# Patient Record
Sex: Female | Born: 1990 | Race: White | Hispanic: No | Marital: Single | Smoking: Current every day smoker
Health system: Southern US, Community
[De-identification: ages and names within clinical notes are randomized; demographics above are authoritative.]

## PROBLEM LIST (undated history)

## (undated) DIAGNOSIS — F329 Major depressive disorder, single episode, unspecified: Secondary | ICD-10-CM

## (undated) DIAGNOSIS — F419 Anxiety disorder, unspecified: Secondary | ICD-10-CM

## (undated) DIAGNOSIS — J45909 Unspecified asthma, uncomplicated: Secondary | ICD-10-CM

## (undated) DIAGNOSIS — K8020 Calculus of gallbladder without cholecystitis without obstruction: Secondary | ICD-10-CM

## (undated) DIAGNOSIS — F32A Depression, unspecified: Secondary | ICD-10-CM

## (undated) DIAGNOSIS — K219 Gastro-esophageal reflux disease without esophagitis: Secondary | ICD-10-CM

## (undated) HISTORY — DX: Unspecified asthma, uncomplicated: J45.909

## (undated) HISTORY — DX: Anxiety disorder, unspecified: F41.9

## (undated) HISTORY — DX: Gastro-esophageal reflux disease without esophagitis: K21.9

## (undated) HISTORY — DX: Depression, unspecified: F32.A

## (undated) HISTORY — DX: Major depressive disorder, single episode, unspecified: F32.9

---
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
## 2009-07-26 ENCOUNTER — Emergency Department (HOSPITAL_COMMUNITY): Admission: EM | Admit: 2009-07-26 | Discharge: 2009-07-26 | Payer: Self-pay | Admitting: Emergency Medicine

## 2011-12-06 ENCOUNTER — Emergency Department: Payer: Self-pay | Admitting: Emergency Medicine

## 2011-12-06 LAB — URINALYSIS, COMPLETE
Bacteria: NONE SEEN
Bilirubin,UR: NEGATIVE
Blood: NEGATIVE
Glucose,UR: NEGATIVE mg/dL (ref 0–75)
Ketone: NEGATIVE
Leukocyte Esterase: NEGATIVE
Nitrite: NEGATIVE
Ph: 5 (ref 4.5–8.0)
Protein: 30
RBC,UR: 2 /HPF (ref 0–5)
Specific Gravity: 1.029 (ref 1.003–1.030)
Squamous Epithelial: 8
WBC UR: 2 /HPF (ref 0–5)

## 2011-12-06 LAB — COMPREHENSIVE METABOLIC PANEL
Albumin: 3.8 g/dL (ref 3.4–5.0)
Alkaline Phosphatase: 68 U/L (ref 50–136)
Anion Gap: 10 (ref 7–16)
BUN: 5 mg/dL — ABNORMAL LOW (ref 7–18)
Bilirubin,Total: 0.4 mg/dL (ref 0.2–1.0)
Calcium, Total: 9.5 mg/dL (ref 8.5–10.1)
Chloride: 101 mmol/L (ref 98–107)
Co2: 25 mmol/L (ref 21–32)
Creatinine: 0.69 mg/dL (ref 0.60–1.30)
EGFR (African American): >60
EGFR (Non-African Amer.): >60
Glucose: 109 mg/dL — ABNORMAL HIGH (ref 65–99)
Osmolality: 270 (ref 275–301)
Potassium: 4.1 mmol/L (ref 3.5–5.1)
SGOT(AST): 27 U/L (ref 15–37)
SGPT (ALT): 25 U/L
Sodium: 136 mmol/L (ref 136–145)
Total Protein: 8.2 g/dL (ref 6.4–8.2)

## 2011-12-06 LAB — CBC
HCT: 44.2 % (ref 35.0–47.0)
HGB: 14.5 g/dL (ref 12.0–16.0)
MCH: 28.7 pg (ref 26.0–34.0)
MCHC: 32.9 g/dL (ref 32.0–36.0)
MCV: 87 fL (ref 80–100)
Platelet: 231 10*3/uL (ref 150–440)
RBC: 5.06 10*6/uL (ref 3.80–5.20)
RDW: 14.3 % (ref 11.5–14.5)
WBC: 17.8 10*3/uL — ABNORMAL HIGH (ref 3.6–11.0)

## 2011-12-06 LAB — PREGNANCY, URINE: Pregnancy Test, Urine: NEGATIVE m[IU]/mL

## 2011-12-06 LAB — LIPASE, BLOOD: Lipase: 117 U/L (ref 73–393)

## 2012-06-14 HISTORY — PX: HAND SURGERY: SHX662

## 2013-01-10 ENCOUNTER — Emergency Department: Payer: Self-pay | Admitting: Emergency Medicine

## 2013-07-10 ENCOUNTER — Emergency Department: Payer: Self-pay

## 2013-07-10 LAB — CBC
HCT: 43 % (ref 35.0–47.0)
HGB: 14.1 g/dL (ref 12.0–16.0)
MCH: 27.7 pg (ref 26.0–34.0)
MCHC: 32.9 g/dL (ref 32.0–36.0)
MCV: 84 fL (ref 80–100)
Platelet: 225 10*3/uL (ref 150–440)
RBC: 5.1 10*6/uL (ref 3.80–5.20)
RDW: 14.7 % — ABNORMAL HIGH (ref 11.5–14.5)
WBC: 8.2 10*3/uL (ref 3.6–11.0)

## 2013-07-10 LAB — COMPREHENSIVE METABOLIC PANEL
Albumin: 3.4 g/dL (ref 3.4–5.0)
Alkaline Phosphatase: 55 U/L
Anion Gap: 6 — ABNORMAL LOW (ref 7–16)
BILIRUBIN TOTAL: 0.2 mg/dL (ref 0.2–1.0)
BUN: 15 mg/dL (ref 7–18)
Calcium, Total: 9.1 mg/dL (ref 8.5–10.1)
Chloride: 107 mmol/L (ref 98–107)
Co2: 26 mmol/L (ref 21–32)
Creatinine: 0.7 mg/dL (ref 0.60–1.30)
EGFR (African American): >60
Glucose: 98 mg/dL (ref 65–99)
Osmolality: 278 (ref 275–301)
POTASSIUM: 3.7 mmol/L (ref 3.5–5.1)
SGOT(AST): 20 U/L (ref 15–37)
SGPT (ALT): 22 U/L (ref 12–78)
Sodium: 139 mmol/L (ref 136–145)
TOTAL PROTEIN: 7.1 g/dL (ref 6.4–8.2)

## 2013-07-10 LAB — URINALYSIS, COMPLETE
Bacteria: NONE SEEN
Bilirubin,UR: NEGATIVE
Blood: NEGATIVE
Glucose,UR: NEGATIVE mg/dL (ref 0–75)
Ketone: NEGATIVE
Leukocyte Esterase: NEGATIVE
Nitrite: NEGATIVE
Ph: 7 (ref 4.5–8.0)
Protein: NEGATIVE
RBC,UR: 2 /HPF (ref 0–5)
Specific Gravity: 1.02 (ref 1.003–1.030)
Squamous Epithelial: 8
WBC UR: 1 /HPF (ref 0–5)

## 2013-07-22 ENCOUNTER — Emergency Department: Payer: Self-pay | Admitting: Emergency Medicine

## 2013-07-24 ENCOUNTER — Ambulatory Visit: Payer: Self-pay | Admitting: Orthopedic Surgery

## 2013-07-26 ENCOUNTER — Ambulatory Visit: Payer: Self-pay | Admitting: Orthopedic Surgery

## 2014-02-16 ENCOUNTER — Emergency Department: Payer: Self-pay | Admitting: Emergency Medicine

## 2014-03-24 IMAGING — CT CT HAND*L* W/O CM
4 series · 16 of 33 positions shown, 19 images · non-contrast
Comparison: None.

CLINICAL DATA: Tripped and fell going up stairs and landed Left
hand. F/U abn hand x-ray showing Comminuted fracture proximal fifth
metacarpal. Dr. Bogdan Cantemir is doing this for surgical planning. no hx
surg.

EXAM:
CT OF THE LEFT HAND WITHOUT CONTRAST
TECHNIQUE: Multidetector CT imaging was performed according to the standard
protocol. Multiplanar CT image reconstructions were also generated.

[Series 603: axial bone · axial · 0.32mm/px · z∈[-90,+17]mm · 5 of 81 slices shown, 7 images]
[im 14/81  soft-tissue]
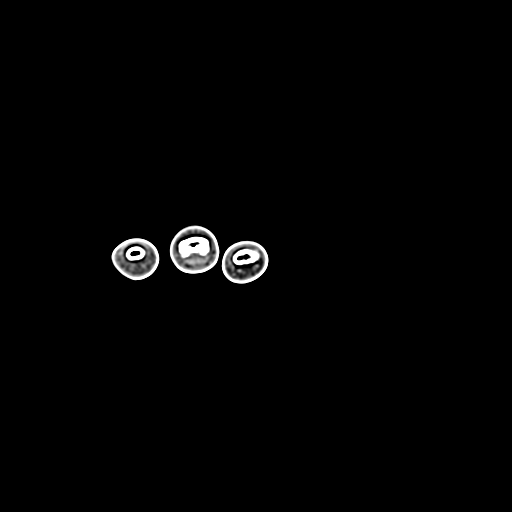
[im 14/81  bone]
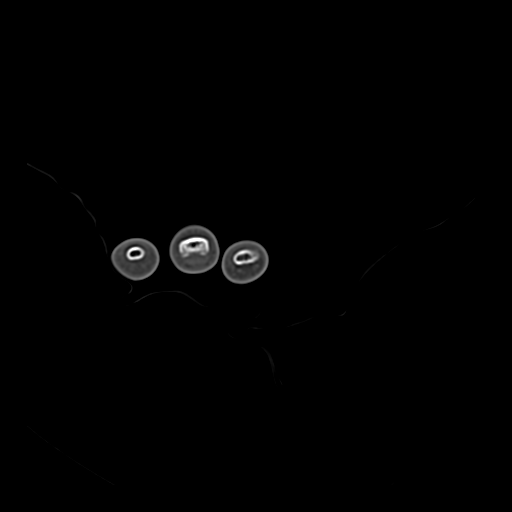
[im 27/81  bone]
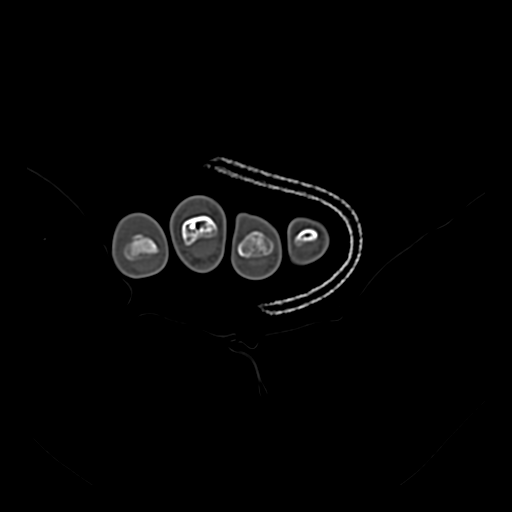
[im 41/81  bone]
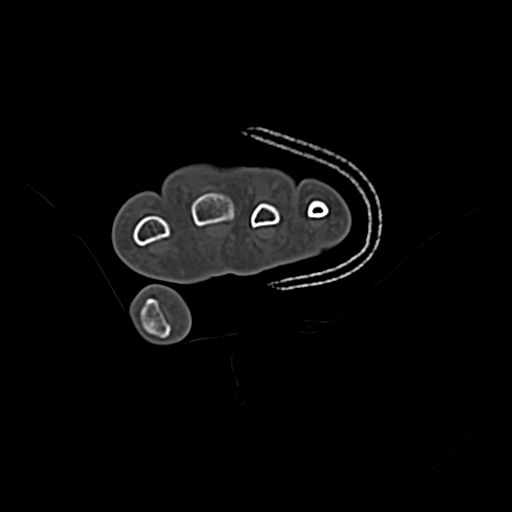
[im 54/81  bone]
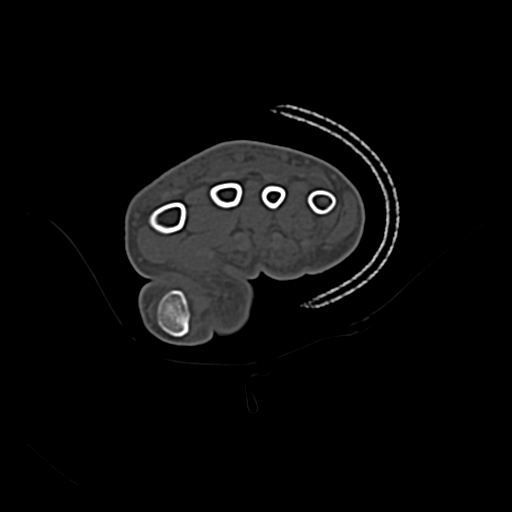
[im 67/81  soft-tissue]
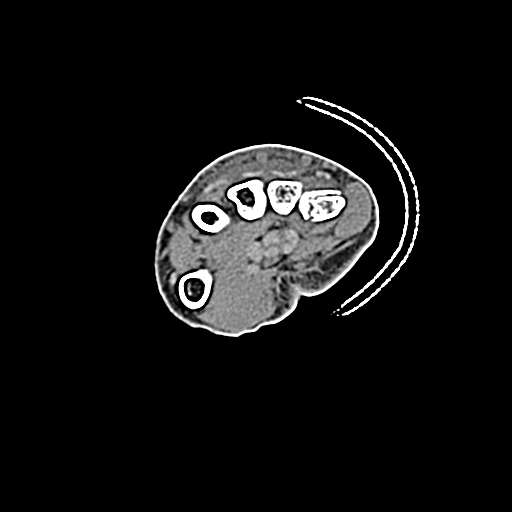
[im 67/81  bone]
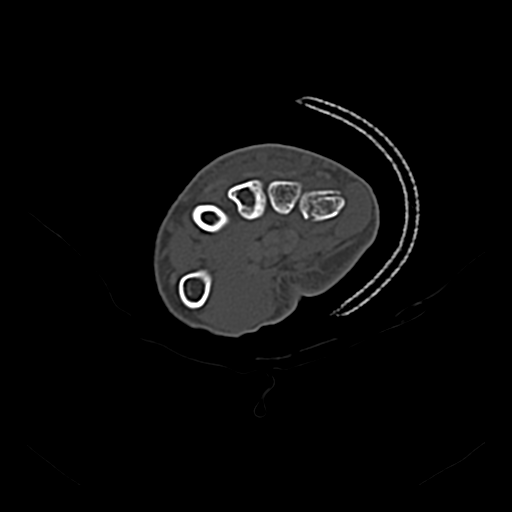

[Series 610: axial soft tissue · axial · 0.32mm/px · z∈[-86,-35]mm · 3 of 80 slices shown]
[im 14/80  soft-tissue]
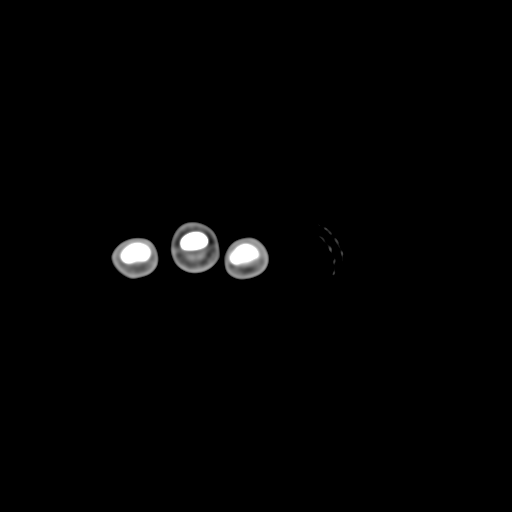
[im 27/80  soft-tissue]
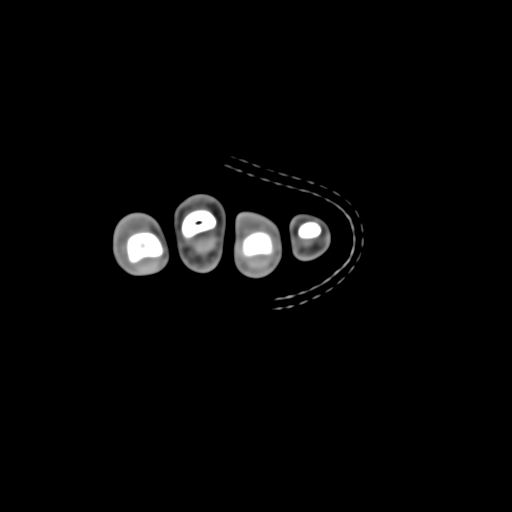
[im 40/80  soft-tissue]
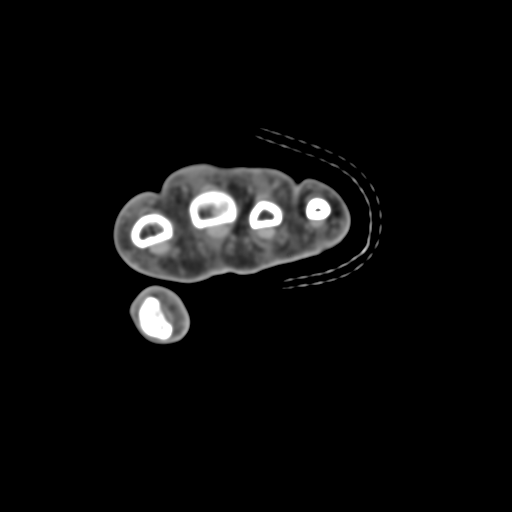

[Series 612: sag soft tissue · sagittal · 0.32mm/px · 5 of 47 slices shown, 6 images]
[im 16/47  bone]
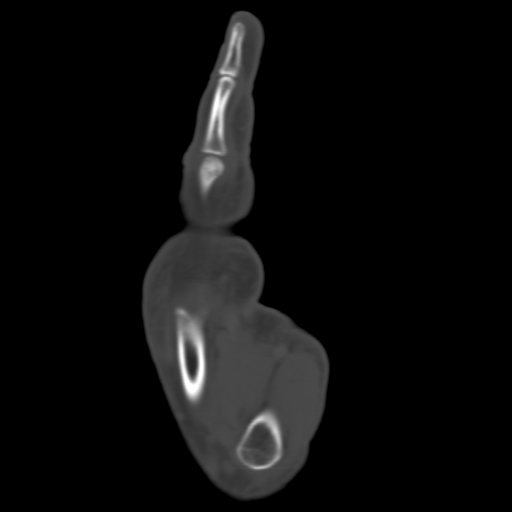
[im 20/47  bone]
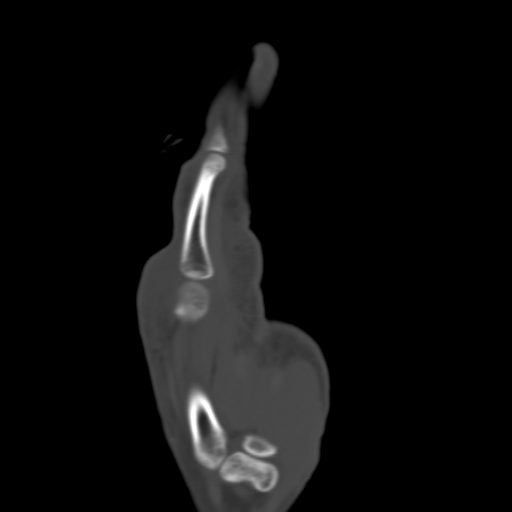
[im 24/47  soft-tissue]
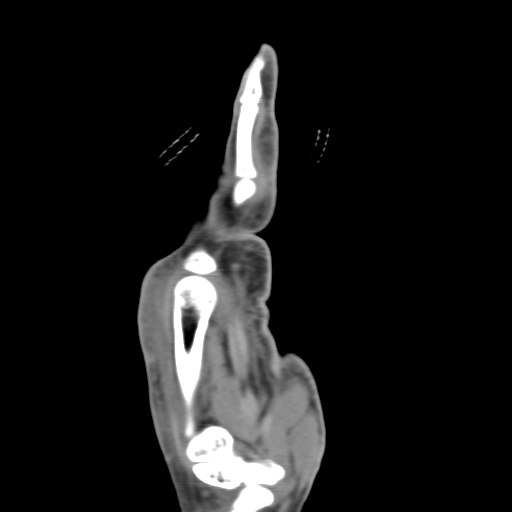
[im 24/47  bone]
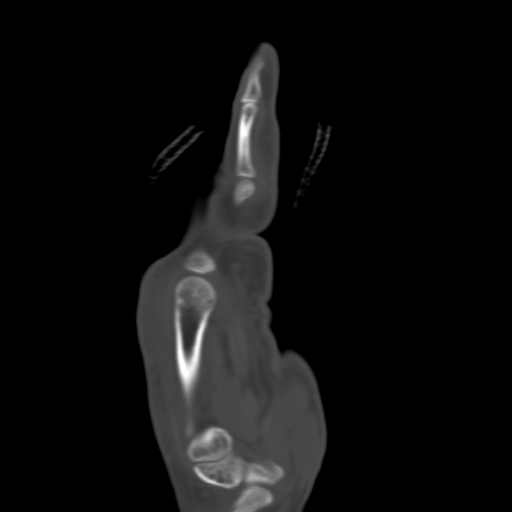
[im 27/47  bone]
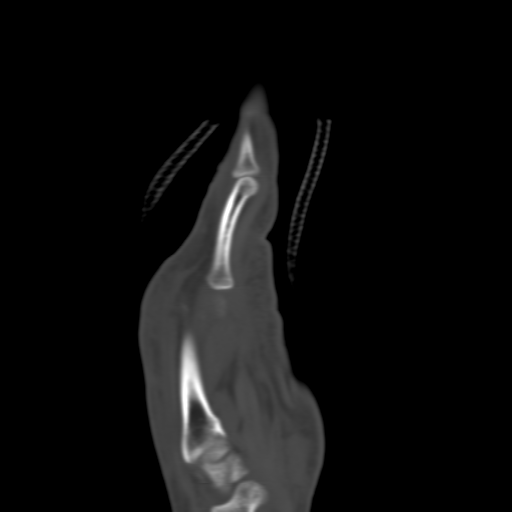
[im 31/47  bone]
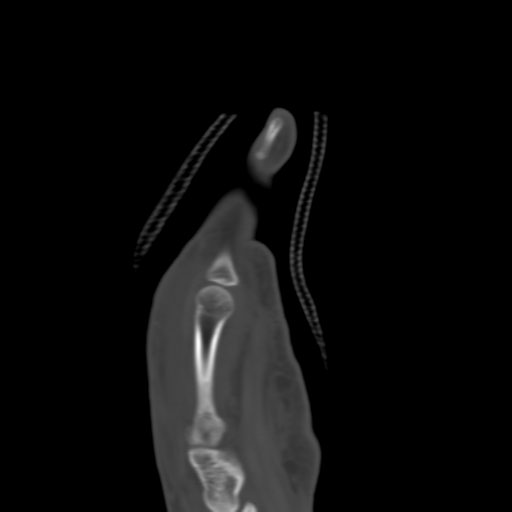

[Series 615: cor soft tissue · coronal · 0.32mm/px · 3 of 30 slices shown]
[im 6/30  bone]
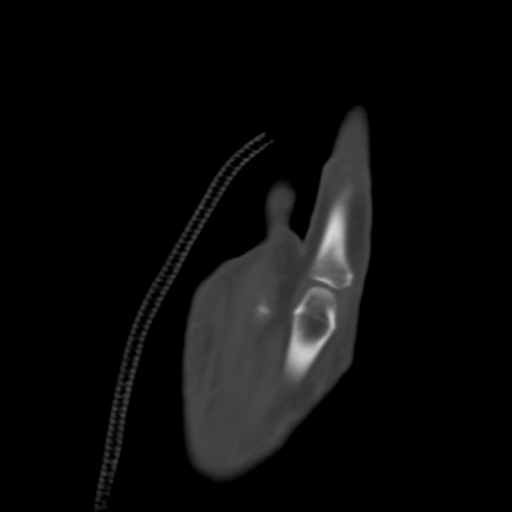
[im 12/30  bone]
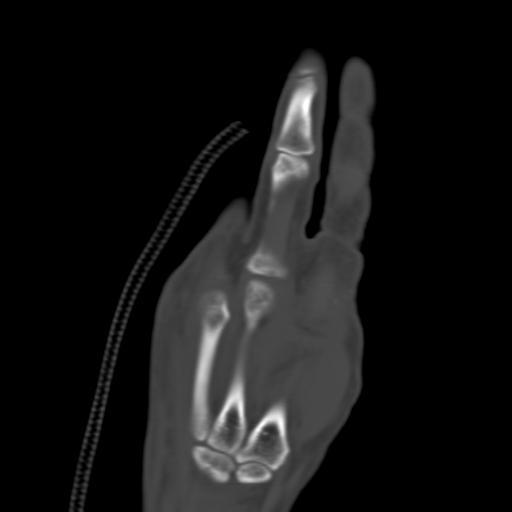
[im 18/30  bone]
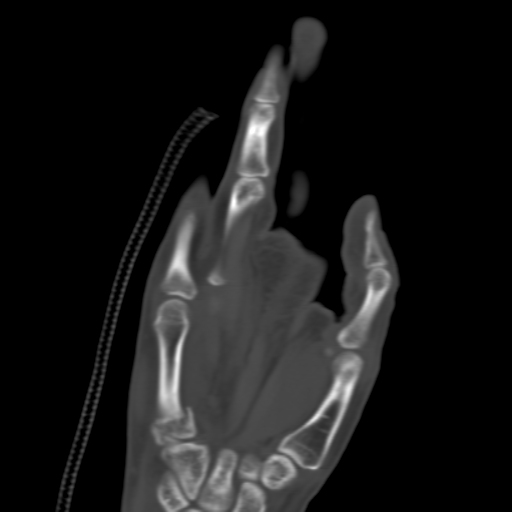

[16 of 33 positions shown; findings below may reference images not displayed]

FINDINGS: There is a mildly comminuted fracture at the base of the fifth
metacarpal with no significant angulation. The fracture cleft
extends to the articular surface of the fifth CMC joint. There is
2.5 mm of displacement of the lateral fracture fragment. The
intermetacarpal articulations are maintained.

There is no other fracture or dislocation. The soft tissues are
normal. There is no fluid collection or hematoma. There is mild soft
tissue swelling along the dorsal aspect of the hand. The flexor and
extensor tendons are grossly intact.
IMPRESSION: Mildly comminuted fracture of the base of the fifth metacarpal with
No significant angulation. The fracture cleft extends to the
articular surface of the fifth CMC joint.

## 2014-03-26 IMAGING — CR DG HAND 2V*L*
1 series · 2 of 2 positions shown · non-contrast
Comparison: 07/22/2013

CLINICAL DATA: Status post fixation of fifth metacarpal fracture

EXAM:
LEFT HAND - 2 VIEW

[Series 1: pa · 0.17mm/px · 2 of 2 slices shown]
[im 1/2]
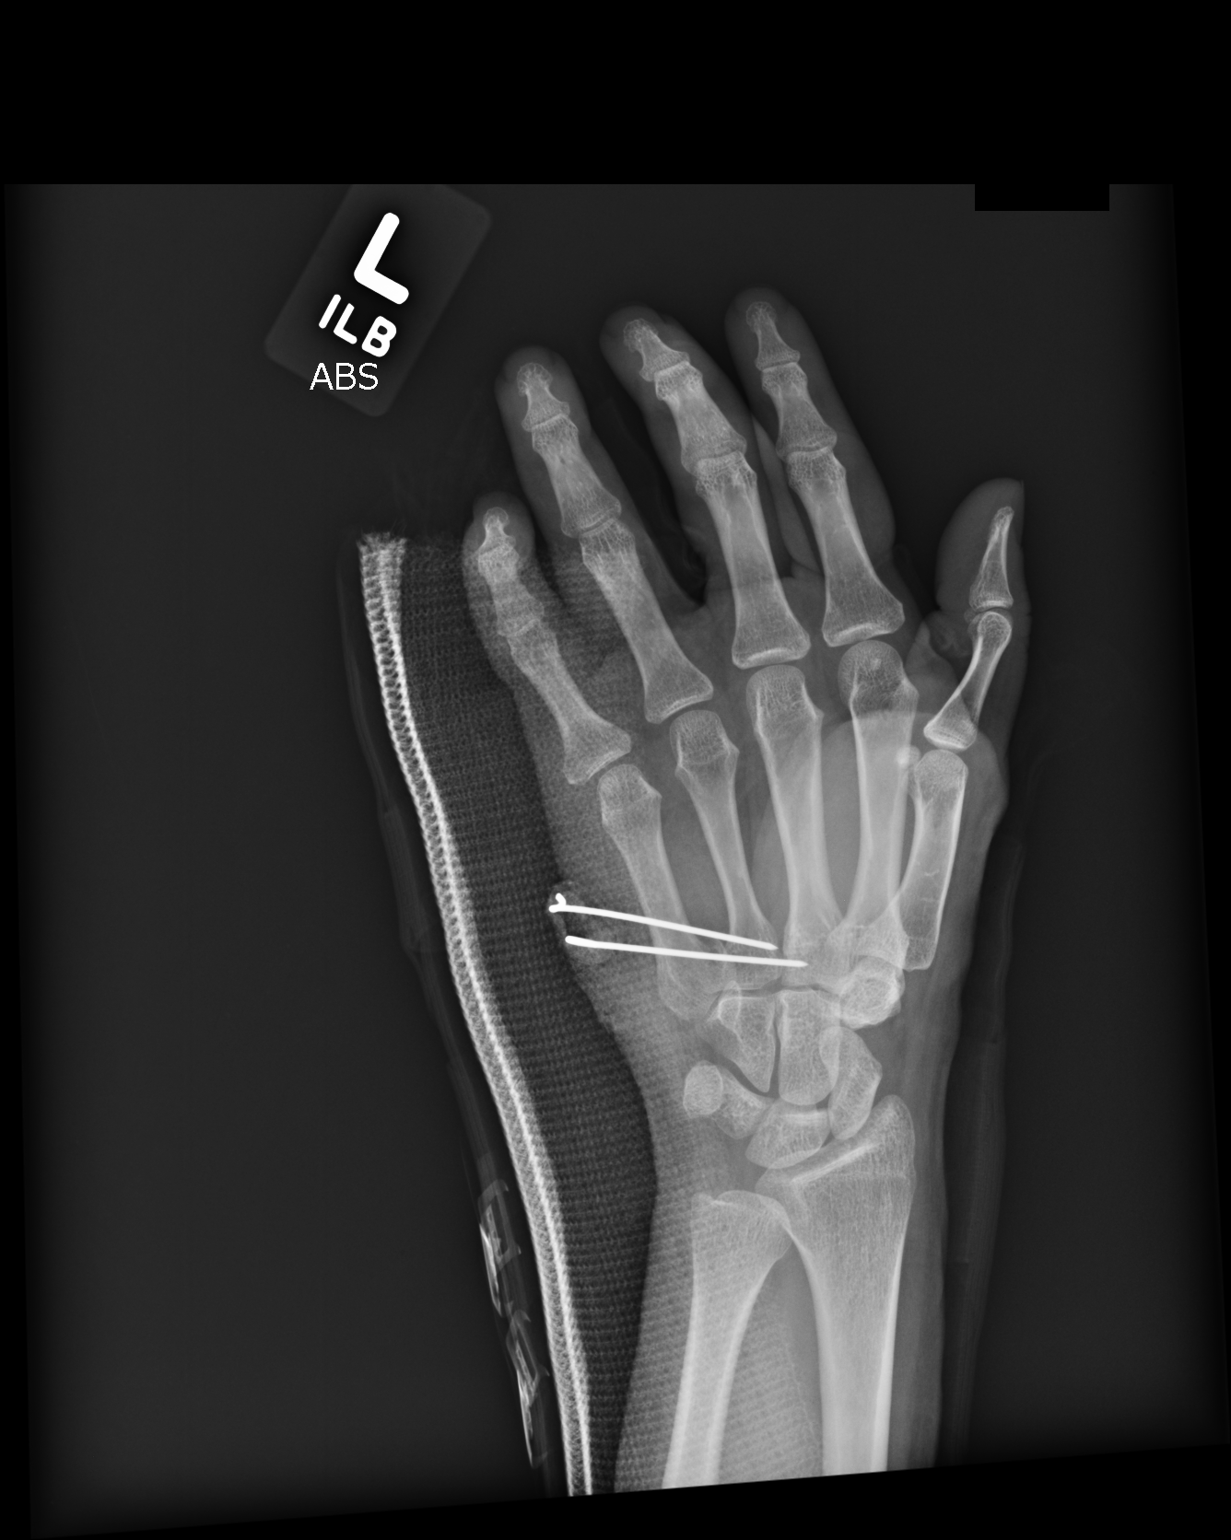
[im 2/2]
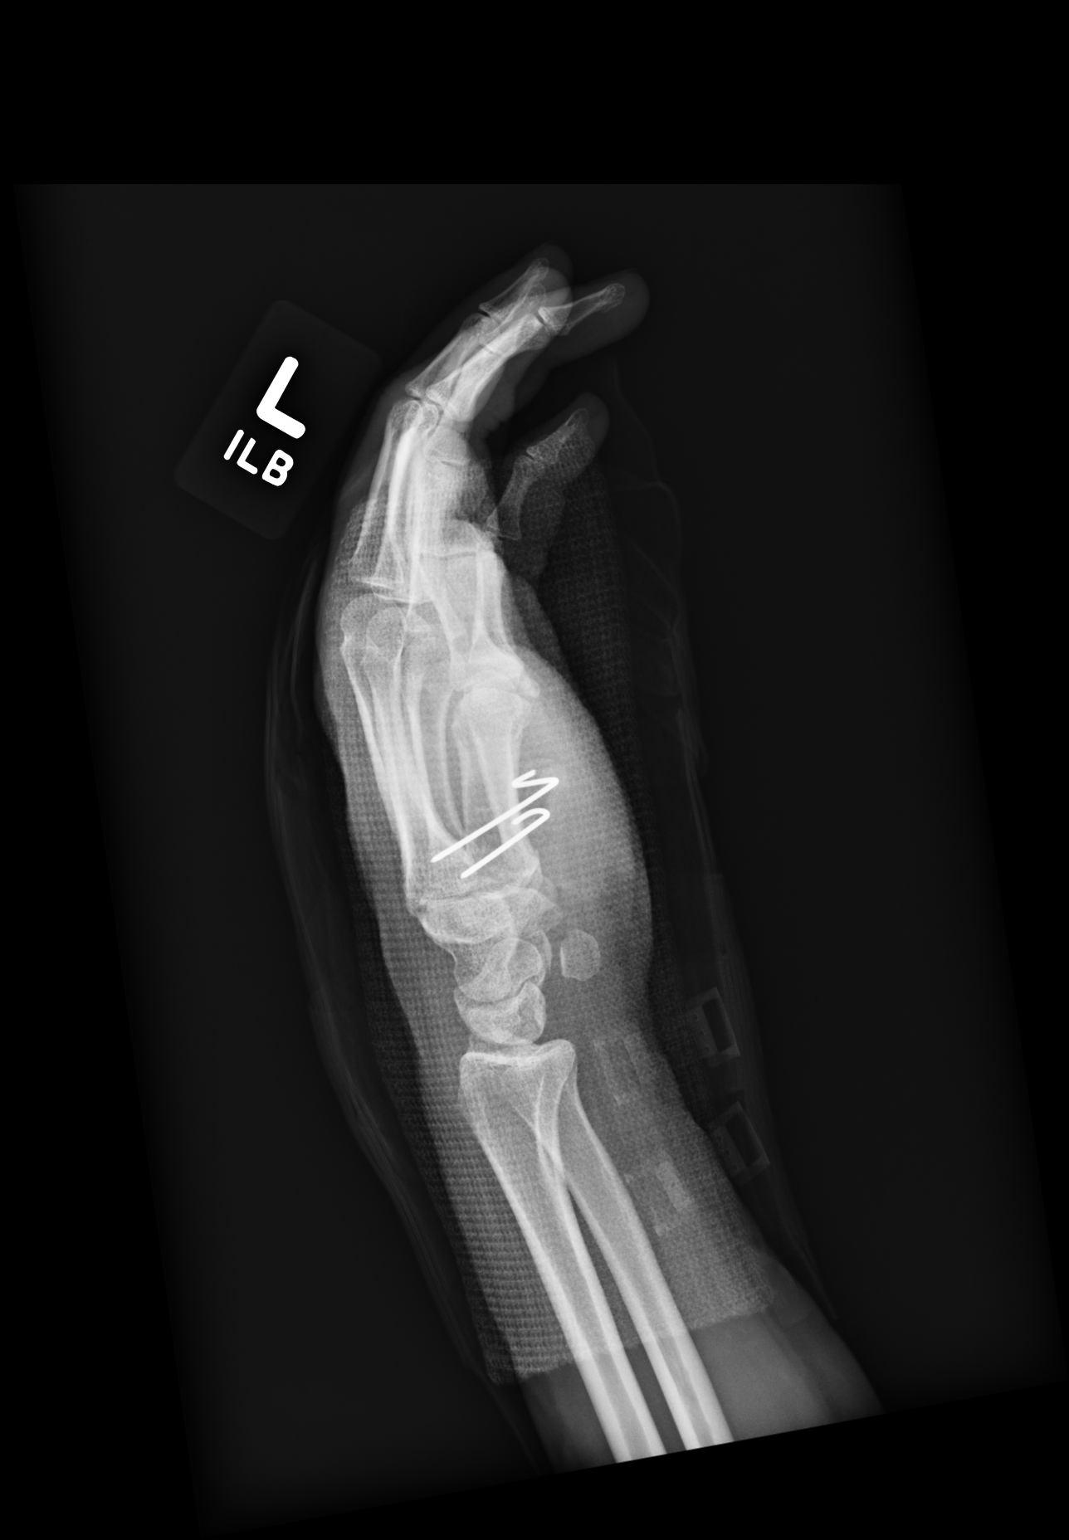

[2 of 2 positions shown; findings below may reference images not displayed]

FINDINGS: Two fixation wires are now noted traversing the base of the fifth
metacarpal. The fracture fragments are in near anatomic alignment.
No new focal abnormality is noted.

## 2014-10-05 NOTE — Op Note (Signed)
PATIENT NAME:  [name], [name] MR#:  [id number] DATE OF BIRTH:  11/30/1990  DATE OF PROCEDURE:  07/26/2013  PREOPERATIVE DIAGNOSIS: Base of left fifth metacarpal fracture.   POSTOPERATIVE DIAGNOSIS: Base of left fifth metacarpal fracture.   PROCEDURE: Closed reduction and pinning, left fifth metacarpal fracture.   ANESTHESIA: General.   SURGEON: Leitha SchullerMichael J. Perle Brickhouse, M.D.   DESCRIPTION OF PROCEDURE: The patient was brought to the operating room and after adequate anesthesia was obtained, the left arm was prepped and draped in the usual sterile fashion. After patient identification and timeout procedures were completed, the fracture was reduced under the mini C-arm and held in place. With the little finger held in place, 2 parallel K-wires were inserted through the metacarpal shaft into the fourth metacarpal as well as into the carpus. This gave rigid fixation and appropriate position, with good stability and anatomic alignment of the fragments. The pins bent over and cut short. The base was covered with Xeroform, followed by 4 x 4'[name], Webril and an ulnar gutter splint. This was covered with an Ace wrap. The patient was then sent to the recovery room in stable condition.   ESTIMATED BLOOD LOSS: Minimal.   COMPLICATIONS: None.   SPECIMEN: None.   IMPLANTS: A 0.45 K-wire x 2.   ____________________________ Leitha SchullerMichael J. Navy Belay, MD mjm:gb D: 07/26/2013 20:08:12 ET T: 07/27/2013 04:03:08 ET JOB#: [id number]  cc: Leitha SchullerMichael J. Tylee Newby, MD, <Dictator> Leitha SchullerMICHAEL J Kajuana Shareef MD ELECTRONICALLY SIGNED 07/27/2013 12:46

## 2014-10-06 NOTE — Consult Note (Signed)
Brief Consult Note: Diagnosis: biliary colic.   Patient was seen by consultant.   Consult note dictated.   Recommend further assessment or treatment.   Discussed with Attending MD.   Comments: Offered admission, pt requested pain meds and dc with f/u in office.  Electronic Signatures: Lattie Hawooper, Juli Odom E (MD)  (Signed 24-Jun-13 18:35)  Authored: Brief Consult Note   Last Updated: 24-Jun-13 18:35 by Lattie Hawooper, Evett Kassa E (MD)

## 2014-10-06 NOTE — Consult Note (Signed)
PATIENT NAMMigdalia Dk:  [name], [name] MR#:  [id number] DATE OF BIRTH:  1990-08-01  DATE OF CONSULTATION:  12/06/2011  REFERRING PHYSICIAN:   CONSULTING PHYSICIAN:  Adah Salvageichard E. Excell Seltzerooper, MD  CHIEF COMPLAINT: Right upper quadrant pain.   HISTORY OF PRESENT ILLNESS: This is a 24 year old Caucasian female patient with an approximately 18 hour history of right upper quadrant pain that started at 3 a.m. and has noted nausea and vomiting. She has vomited multiple times, however, since being in the Emergency Room her pain has subsided slightly but is still present. Her nausea is controlled. She has had no fevers but subjective chills. Has had one episode like this before but does not relate it to fatty food intolerance, etc. Has had normal bowel movements. Denies melena or hematochezia.   PAST MEDICAL HISTORY: None.   PAST SURGICAL HISTORY: Elbow surgery.   ALLERGIES: None.   MEDICATIONS: OCPs.   FAMILY HISTORY: Noncontributory.   SOCIAL HISTORY: She works as a Child psychotherapistwaitress. Smokes tobacco. Does not drink alcohol.   REVIEW OF SYSTEMS: 10 system review was performed and negative with the exception of that mentioned in the history of present illness.   PHYSICAL EXAMINATION:   GENERAL: Healthy, comfortable-appearing Caucasian female patient. She moves around in the bed quite easily without difficulty or splinting.   VITAL SIGNS: Vital signs show a temperature of 97.6, pulse 61, respirations 18, blood pressure 111/65. Pain scale of 4 from 7. Room air sats 99.   HEENT: No scleral icterus.   NECK: No palpable neck nodes.   CHEST: Clear to auscultation.   CARDIAC: Regular rate and rhythm.   ABDOMEN: Soft. There is tenderness in the right upper quadrant with a negative Murphy's sign. No percussion or rebound tenderness. No guarding.   EXTREMITIES: Without edema. Calves are nontender.   INTEGUMENTARY: No jaundice.   LABORATORY, DIAGNOSTIC, AND RADIOLOGICAL DATA: Laboratory values demonstrate a negative  urine pregnancy test. Normal urinalysis. Lipase 117. Normal liver function tests. White blood cell count 17.8 with a hemoglobin and hematocrit of 14 and 44.   An ultrasound is read as normal.   A CT scan suggests calcified small gallstone, otherwise normal.   ASSESSMENT AND PLAN: This is a patient with right upper quadrant pain. It is the second episode and has been going on for approximately 18 hours but is improved. Her nausea and vomiting is improved with medications. I suspect that this is biliary colic. I offered admission to the hospital with possible surgery and the patient and her mother wish to be discharged and follow-up in the office. I suggested pain medications and possible antibiotics. I discussed this with Dr. Mayford KnifeWilliams who will give the patient medications and they will follow-up in my office as needed. The family was in agreement with this plan.   ____________________________ Adah Salvageichard E. Excell Seltzerooper, MD rec:drc D: 12/06/2011 18:38:47 ET T: 12/07/2011 09:28:02 ET JOB#: [id number]  cc: Adah Salvageichard E. Excell Seltzerooper, MD, <Dictator> Lattie HawICHARD E Kamiah Fite MD ELECTRONICALLY SIGNED 12/07/2011 10:11

## 2017-03-04 ENCOUNTER — Telehealth: Payer: Self-pay

## 2017-03-04 NOTE — Telephone Encounter (Signed)
error 

## 2017-04-14 LAB — HM PAP SMEAR: HM Pap smear: NEGATIVE

## 2018-04-18 LAB — TSH: TSH: 2.36 (ref 0.41–5.90)

## 2018-05-01 ENCOUNTER — Telehealth: Payer: Self-pay | Admitting: General Practice

## 2018-05-01 NOTE — Telephone Encounter (Signed)
I left vm for pt to r/s 11/21 appt due to provider being out of office.

## 2018-05-04 ENCOUNTER — Ambulatory Visit: Payer: Self-pay | Admitting: Family Medicine

## 2018-05-09 ENCOUNTER — Encounter: Payer: Self-pay | Admitting: Family Medicine

## 2018-05-09 ENCOUNTER — Ambulatory Visit (INDEPENDENT_AMBULATORY_CARE_PROVIDER_SITE_OTHER): Payer: Managed Care, Other (non HMO) | Admitting: Family Medicine

## 2018-05-09 VITALS — BP 102/72 | HR 82 | Temp 98.4°F | Ht 63.0 in | Wt 152.5 lb

## 2018-05-09 DIAGNOSIS — F909 Attention-deficit hyperactivity disorder, unspecified type: Secondary | ICD-10-CM | POA: Insufficient documentation

## 2018-05-09 DIAGNOSIS — Z720 Tobacco use: Secondary | ICD-10-CM | POA: Diagnosis not present

## 2018-05-09 DIAGNOSIS — R000 Tachycardia, unspecified: Secondary | ICD-10-CM | POA: Diagnosis not present

## 2018-05-09 DIAGNOSIS — R635 Abnormal weight gain: Secondary | ICD-10-CM | POA: Diagnosis not present

## 2018-05-09 DIAGNOSIS — L709 Acne, unspecified: Secondary | ICD-10-CM

## 2018-05-09 LAB — CBC
HEMATOCRIT: 41 % (ref 36.0–46.0)
HEMOGLOBIN: 13.7 g/dL (ref 12.0–15.0)
MCHC: 33.3 g/dL (ref 30.0–36.0)
MCV: 84.2 fl (ref 78.0–100.0)
Platelets: 261 10*3/uL (ref 150.0–400.0)
RBC: 4.87 Mil/uL (ref 3.87–5.11)
RDW: 13.7 % (ref 11.5–15.5)
WBC: 7.5 10*3/uL (ref 4.0–10.5)

## 2018-05-09 LAB — COMPREHENSIVE METABOLIC PANEL
ALK PHOS: 48 U/L (ref 39–117)
ALT: 24 U/L (ref 0–35)
AST: 19 U/L (ref 0–37)
Albumin: 4.1 g/dL (ref 3.5–5.2)
BUN: 8 mg/dL (ref 6–23)
CO2: 26 mEq/L (ref 19–32)
CREATININE: 0.75 mg/dL (ref 0.40–1.20)
Calcium: 9.2 mg/dL (ref 8.4–10.5)
Chloride: 104 mEq/L (ref 96–112)
GFR: 97.83 mL/min (ref >60.00)
GLUCOSE: 96 mg/dL (ref 70–99)
POTASSIUM: 3.7 meq/L (ref 3.5–5.1)
Sodium: 137 mEq/L (ref 135–145)
TOTAL PROTEIN: 6.9 g/dL (ref 6.0–8.3)
Total Bilirubin: 0.6 mg/dL (ref 0.2–1.2)

## 2018-05-09 LAB — LIPID PANEL
Cholesterol: 173 mg/dL (ref 0–200)
HDL: 58.9 mg/dL (ref >39.00)
LDL Cholesterol: 102 mg/dL — ABNORMAL HIGH (ref 0–99)
NONHDL: 113.99
TRIGLYCERIDES: 62 mg/dL (ref 0.0–149.0)
Total CHOL/HDL Ratio: 3
VLDL: 12.4 mg/dL (ref 0.0–40.0)

## 2018-05-09 NOTE — Assessment & Plan Note (Signed)
Follows with psychiatry. Stable on current medications

## 2018-05-09 NOTE — Assessment & Plan Note (Signed)
Not interested in quitting 

## 2018-05-09 NOTE — Assessment & Plan Note (Signed)
20 lb weight gain over the last year, and 10 lb up from pre-adderall weight loss. Reports normal TSH with GYN (will get records). Labs done to today, though no sign of cushing symptoms. Suspect excess calories - discussed adding exercise and decreasing soda/high calorie drinks

## 2018-05-09 NOTE — Progress Notes (Signed)
Subjective:     [name] is a 27 y.o. female presenting for Establish Care (previous PCP Dr. Vear ClockPhillips and he passed away a few years back and so she has been going to her gyn and psychiatrist.); Weight Gain (patient wants to have lab work done, full panel. her gynecologist also recommended this.); Numbness (left hand mainly and sometimes right hand.); and Tachycardia (off and on, for over a year. no specific pattern to this. Does get hot flashes during this period also.)     HPI   #Weight gain - has not exercised regularly for 3 years - used to exercise 2-3 times per week - Diet - chicken with veggies/potatoe, mostly chicken as meat, does not eat breakfast (usually will grab a bar), lunch does not typically eat - coffee and soda (2-3, 12 oz can typically), will drink occasional redbull - feels like her appetite is coming back after initial drop in symptoms - on adderall x 2 years - prior to starting weighed about 145 lbs - had some weight loss initially with the adderall was down as low as 118 - then over the last year 135 lb to 155 lb  OB/GYN checked the thyroid which was normal  Did change jobs this time last year and bought a home  #Tachycardia - on adderall XR in the morning and then the short acting at 2 pm and 6 pm - very sporadic and random - does not seem to have a rhyme or reason - sometimes in the shower, mowing - will get very hot - sweating, racing heart, feels nauseous - less since changing birth control - symptoms x 1 year - happened last month - typically happening 1-2x months  - symptoms 5-15 minutes and improves with cooling body temperature        Review of Systems No fever/chills No nausea/vomiting  Social History   Tobacco Use  Smoking Status Current Every Day Smoker  . Packs/day: 0.25  . Years: 12.00  . Pack years: 3.00  . Types: Cigarettes  Smokeless Tobacco Never Used        Objective:     BP 102/72   Pulse 82   Temp 98.4  F (36.9 C)   Ht 5\' 3"  (1.6 m)   Wt 152 lb 8 oz (69.2 kg)   LMP 03/29/2018   SpO2 97%   BMI 27.01 kg/m   Body mass index is 27.01 kg/m.   Physical Exam  Constitutional: She appears well-developed and well-nourished. No distress.  HENT:  Right Ear: External ear normal.  Left Ear: External ear normal.  Eyes: Conjunctivae and EOM are normal.  Neck: Neck supple.  Cardiovascular: Normal rate, regular rhythm and normal heart sounds.  No murmur heard. Pulmonary/Chest: Effort normal and breath sounds normal. No respiratory distress.  Neurological: She is alert.  Skin: Skin is warm and dry. Capillary refill takes less than 2 seconds. She is not diaphoretic.  Bilateral acne on the lower cheek  Psychiatric: She has a normal mood and affect.          Assessment & Plan:   Problem List Items Addressed This Visit      Other   Tobacco abuse    Not interested in quitting      ADHD    Follows with psychiatry. Stable on current medications      Weight gain - Primary    20 lb weight gain over the last year, and 10 lb up from pre-adderall weight loss.  Reports normal TSH with GYN (will get records). Labs done to today, though no sign of cushing symptoms. Suspect excess calories - discussed adding exercise and decreasing soda/high calorie drinks      Relevant Orders   Lipid panel   Comprehensive metabolic panel   CBC    Other Visit Diagnoses    Tachycardia       Acne, unspecified acne type       Relevant Medications   Norethindrone Acetate-Ethinyl Estrad-FE (BLISOVI 24 FE) 1-20 MG-MCG(24) tablet     Acne - advised over the counter salcylic acid face wash to start  Tachycardia - Unclear what is causing the episodes - could be triggered 2/2 to caffeine/dehydration. Discussed that a heart monitor could be done if they were continuing to happen but it has been >1 month so may not capture the episode. Recommend diary for future episodes and to follow-up for monitor if happening  more often  Return to discuss numbness    Return if symptoms worsen or fail to improve, for numbness.  Lynnda Child, MD

## 2018-05-09 NOTE — Patient Instructions (Addendum)
Weight Gain - start calorie counting - Try to add more activity - even 500 more steps per day - try to eat whole foods - fruits/veggies, and try to eat whole grains - try to cut out 1 soda a day   Tachycardia - consider cutting back on Caffeine - Make sure you stay hydrated - keep a journal of when it is happening and what you ate/drank that day - If it starts happening more often, let me know and we will get a heart monitor

## 2018-05-25 ENCOUNTER — Encounter: Payer: Self-pay | Admitting: Obstetrics and Gynecology

## 2018-05-25 LAB — FREE THYROXINE INDEX: Free Thyroxine Index: 2.8

## 2018-05-25 LAB — T3 UPTAKE: T3 Uptake: 23

## 2018-05-25 LAB — T4 (THYROXINE), TOTAL (REFL): T4, Total: 12.2

## 2019-07-11 ENCOUNTER — Encounter: Payer: Self-pay | Admitting: Family Medicine

## 2019-07-11 ENCOUNTER — Other Ambulatory Visit: Payer: Self-pay

## 2019-07-11 ENCOUNTER — Ambulatory Visit (INDEPENDENT_AMBULATORY_CARE_PROVIDER_SITE_OTHER): Payer: Managed Care, Other (non HMO) | Admitting: Family Medicine

## 2019-07-11 VITALS — Wt 152.0 lb

## 2019-07-11 DIAGNOSIS — J0140 Acute pansinusitis, unspecified: Secondary | ICD-10-CM

## 2019-07-11 MED ORDER — AMOXICILLIN-POT CLAVULANATE 875-125 MG PO TABS: 1.0000 | ORAL_TABLET | Freq: Two times a day (BID) | ORAL | 0 refills | Status: AC

## 2019-07-11 NOTE — Progress Notes (Signed)
    I connected with [name] on 07/11/19 at  9:00 AM EST by video and verified that I am speaking with the correct person using two identifiers.   I discussed the limitations, risks, security and privacy concerns of performing an evaluation and management service by video and the availability of in person appointments. I also discussed with the patient that there may be a patient responsible charge related to this service. The patient expressed understanding and agreed to proceed.  Patient location: Home Provider Location: [place] Participants: Lynnda Child and Jaxon Mynhier Garate   Subjective:     [name] is a 29 y.o. female presenting for Nasal Congestion (chest tightness and chest sore, dry cough, slight sore throat.)     Sinusitis This is a recurrent problem. The current episode started 1 to 4 weeks ago. The problem has been gradually worsening since onset. There has been no fever. Associated symptoms include congestion, coughing (dry), ear pain, headaches, sinus pressure and a sore throat. Pertinent negatives include no chills.         Review of Systems  Constitutional: Positive for fatigue. Negative for chills and fever.  HENT: Positive for congestion, ear pain, sinus pressure, sinus pain and sore throat. Negative for dental problem.   Respiratory: Positive for cough (dry).   Musculoskeletal:       Achy  Neurological: Positive for headaches.     Social History   Tobacco Use  Smoking Status Current Every Day Smoker  . Packs/day: 0.25  . Years: 12.00  . Pack years: 3.00  . Types: Cigarettes  Smokeless Tobacco Never Used        Objective:   BP Readings from Last 3 Encounters:  05/09/18 102/72   Wt Readings from Last 3 Encounters:  07/11/19 152 lb (68.9 kg)  05/09/18 152 lb 8 oz (69.2 kg)   Wt 152 lb (68.9 kg) Comment: per patient  LMP 06/09/2019   BMI 26.93 kg/m    Physical Exam Constitutional:      Appearance: Normal  appearance. She is not ill-appearing.  HENT:     Head: Normocephalic and atraumatic.     Right Ear: External ear normal.     Left Ear: External ear normal.  Eyes:     Conjunctiva/sclera: Conjunctivae normal.  Pulmonary:     Effort: Pulmonary effort is normal. No respiratory distress.  Neurological:     Mental Status: She is alert. Mental status is at baseline.  Psychiatric:        Mood and Affect: Mood normal.        Behavior: Behavior normal.        Thought Content: Thought content normal.        Judgment: Judgment normal.             Assessment & Plan:   Problem List Items Addressed This Visit    None    Visit Diagnoses    Acute non-recurrent pansinusitis    -  Primary   Relevant Medications   amoxicillin-clavulanate (AUGMENTIN) 875-125 MG tablet     Symptomatic care Abx Return if not improving  Return if symptoms worsen or fail to improve.  Lynnda Child, MD

## 2023-01-21 ENCOUNTER — Ambulatory Visit
Admission: RE | Admit: 2023-01-21 | Discharge: 2023-01-21 | Disposition: A | Discharge status: Home / Self Care | Payer: Managed Care, Other (non HMO) | Source: Ambulatory Visit | Attending: Family Medicine

## 2023-01-21 VITALS — BP 108/74 | HR 105 | Temp 98.8°F | Resp 18

## 2023-01-21 DIAGNOSIS — J392 Other diseases of pharynx: Secondary | ICD-10-CM

## 2023-01-21 DIAGNOSIS — J208 Acute bronchitis due to other specified organisms: Secondary | ICD-10-CM

## 2023-01-21 LAB — POCT RAPID STREP A (OFFICE): Rapid Strep A Screen: NEGATIVE

## 2023-01-21 MED ORDER — PREDNISONE 20 MG PO TABS: 40.0000 mg | ORAL_TABLET | Freq: Every day | ORAL | 0 refills | Status: DC

## 2023-01-21 MED ORDER — AZITHROMYCIN 250 MG PO TABS: ORAL_TABLET | ORAL | 0 refills | Status: DC

## 2023-01-21 MED ORDER — PROMETHAZINE-DM 6.25-15 MG/5ML PO SYRP
5.0000 mL | ORAL_SOLUTION | Freq: Four times a day (QID) | ORAL | 0 refills | Status: DC | PRN
Start: 2023-01-21 — End: 2023-11-15

## 2023-01-21 NOTE — ED Triage Notes (Signed)
Sore throat, headache, fatigue, cough, that started 4 days ago. Taking Advil.

## 2023-01-21 NOTE — ED Provider Notes (Signed)
[name]    CSN: [id number] Arrival date & time: 01/21/23  1340      History   Chief Complaint Chief Complaint  Patient presents with   Sore Throat    I believe I either have bronchitis or a sinus infection. I am also having headaches. I took 2 COVID test at home and the results for both test are NEGATIVE. - Entered by patient    HPI [name] is a 32 y.o. female.   HPI Patient presents today with a 4-day history of sore throat, headache, generalized fatigue, cough.  Denies any shortness of breath,endorses chest tightness, no  wheezing.  Patient has a history of asthma and current smoker.  Patient has been taking Advil for symptoms without improvement.  Unaware of any known sick contacts. Past Medical History:  Diagnosis Date   Anxiety    Asthma    Depression    GERD (gastroesophageal reflux disease)     Patient Active Problem List   Diagnosis Date Noted   Tobacco abuse 05/09/2018   ADHD 05/09/2018   Weight gain 05/09/2018    Past Surgical History:  Procedure Laterality Date   ELBOW SURGERY  2003   HAND SURGERY  2014    OB History   No obstetric history on file.      Home Medications    Prior to Admission medications   Medication Sig Start Date End Date Taking? Authorizing Provider  amphetamine-dextroamphetamine (ADDERALL XR) 30 MG 24 hr capsule Take 30 mg by mouth daily.  01/28/16  Yes [provider]  amphetamine-dextroamphetamine (ADDERALL) 30 MG tablet Take 30 mg by mouth 2 (two) times daily.  02/26/18  Yes [provider]  azithromycin (ZITHROMAX) 250 MG tablet Take 2 tabs PO x 1 dose, then 1 tab PO QD x 4 days 01/21/23  Yes Bing Neighbors, NP  clonazePAM (KLONOPIN) 1 MG tablet Take 1 mg by mouth 3 (three) times daily as needed.    Yes [provider]  predniSONE (DELTASONE) 20 MG tablet Take 2 tablets (40 mg total) by mouth daily with breakfast. 01/21/23  Yes Bing Neighbors, NP  promethazine-dextromethorphan  (PROMETHAZINE-DM) 6.25-15 MG/5ML syrup Take 5 mLs by mouth 4 (four) times daily as needed for cough. 01/21/23  Yes Bing Neighbors, NP  Norethindrone Acetate-Ethinyl Estrad-FE (BLISOVI 24 FE) 1-20 MG-MCG(24) tablet Take 1 tablet by mouth daily.  12/29/17   [provider]    Family History Family History  Problem Relation Age of Onset   Arthritis Father    Depression Father    Diabetes Father    Anxiety disorder Father    Cancer Paternal Grandfather        prostate     Social History Social History   Tobacco Use   Smoking status: Every Day    Current packs/day: 0.25    Average packs/day: 0.3 packs/day for 12.0 years (3.0 ttl pk-yrs)    Types: Cigarettes   Smokeless tobacco: Never  Vaping Use   Vaping status: Never Used  Substance Use Topics   Alcohol use: Yes    Comment: once a week, 1-2 drinks   Drug use: Never     Allergies   Codeine   Review of Systems Review of Systems Pertinent negatives listed in HPI   Physical Exam Triage Vital Signs ED Triage Vitals  Encounter Vitals Group     BP 01/21/23 1421 108/74     Systolic BP Percentile --  Diastolic BP Percentile --      Pulse Rate 01/21/23 1421 (!) 105     Resp 01/21/23 1421 18     Temp 01/21/23 1421 98.8 F (37.1 C)     Temp src --      SpO2 01/21/23 1421 97 %     Weight --      Height --      Head Circumference --      Peak Flow --      Pain Score 01/21/23 1425 4     Pain Loc --      Pain Education --      Exclude from Growth Chart --    No data found.  Updated Vital Signs BP 108/74   Pulse (!) 105   Temp 98.8 F (37.1 C)   Resp 18   LMP 01/07/2023 (Approximate)   SpO2 97%   Visual Acuity Right Eye Distance:   Left Eye Distance:   Bilateral Distance:    Right Eye Near:   Left Eye Near:    Bilateral Near:     Physical Exam Vitals reviewed.  Constitutional:      Appearance: She is well-developed.  HENT:     Head: Normocephalic and atraumatic.     Right Ear: Tympanic  membrane and ear canal normal.     Left Ear: Tympanic membrane and ear canal normal.     Nose: Mucosal edema, congestion and rhinorrhea present. Rhinorrhea is purulent.     Right Turbinates: Enlarged.     Left Turbinates: Enlarged.     Mouth/Throat:     Mouth: Mucous membranes are moist. Mucous membranes are pale.     Tonsils: No tonsillar exudate or tonsillar abscesses.  Eyes:     Conjunctiva/sclera: Conjunctivae normal.     Pupils: Pupils are equal, round, and reactive to light.  Cardiovascular:     Rate and Rhythm: Normal rate and regular rhythm.  Pulmonary:     Effort: Pulmonary effort is normal.     Breath sounds: Normal breath sounds.  Abdominal:     General: Bowel sounds are normal.     Palpations: Abdomen is soft.  Lymphadenopathy:     Cervical: Cervical adenopathy present.  Skin:    Capillary Refill: Capillary refill takes less than 2 seconds.  Neurological:     General: No focal deficit present.     Mental Status: She is alert.     UC Treatments / Results  Labs (all labs ordered are listed, but only abnormal results are displayed) Labs Reviewed  POCT RAPID STREP A (OFFICE)    EKG   Radiology No results found.  Procedures Procedures (including critical care time)  Medications Ordered in UC Medications - No data to display  Initial Impression / Assessment and Plan / UC Course  I have reviewed the triage vital signs and the nursing notes.  Pertinent labs & imaging results that were available during my care of the patient were reviewed by me and considered in my medical decision making (see chart for details).    Acute Bronchitis and Throat Irritation Treatment per discharge medication orders. Home COVID test negative and rapid strep negative here in clinic. Return precautions given. Final Clinical Impressions(s) / UC Diagnoses   Final diagnoses:  Acute bronchitis due to other specified organisms  Throat irritation   Discharge Instructions    None    ED Prescriptions     Medication Sig Dispense Auth. Provider   azithromycin (ZITHROMAX) 250 MG tablet  Take 2 tabs PO x 1 dose, then 1 tab PO QD x 4 days 6 tablet Bing Neighbors, NP   promethazine-dextromethorphan (PROMETHAZINE-DM) 6.25-15 MG/5ML syrup Take 5 mLs by mouth 4 (four) times daily as needed for cough. 180 mL Bing Neighbors, NP   predniSONE (DELTASONE) 20 MG tablet Take 2 tablets (40 mg total) by mouth daily with breakfast. 10 tablet Bing Neighbors, NP      PDMP not reviewed this encounter.   Bing Neighbors, NP 01/24/23 [phone]

## 2023-05-23 LAB — HM PAP SMEAR

## 2023-08-03 ENCOUNTER — Other Ambulatory Visit: Payer: Self-pay | Admitting: Family

## 2023-08-03 DIAGNOSIS — R102 Pelvic and perineal pain: Secondary | ICD-10-CM

## 2023-08-22 ENCOUNTER — Other Ambulatory Visit: Payer: Managed Care, Other (non HMO)

## 2023-08-22 ENCOUNTER — Ambulatory Visit
Admission: RE | Admit: 2023-08-22 | Discharge: 2023-08-22 | Disposition: A | Discharge status: Home / Self Care | Payer: Managed Care, Other (non HMO) | Source: Ambulatory Visit | Attending: Family | Admitting: Family

## 2023-08-22 DIAGNOSIS — R102 Pelvic and perineal pain: Secondary | ICD-10-CM

## 2023-08-22 MED ORDER — GADOPICLENOL 0.5 MMOL/ML IV SOLN
7.5000 mL | Freq: Once | INTRAVENOUS | Status: AC | PRN
  Administered 2023-08-22: 7 mL via INTRAVENOUS

## 2023-09-15 ENCOUNTER — Encounter: Payer: Self-pay | Admitting: Physician Assistant

## 2023-11-14 NOTE — Progress Notes (Signed)
 Brigitte Canard, PA-C [address] [place], [place]  [address] Phone: [phone]   Gastroenterology Consultation  Referring Provider:     Reford Canterbury, MD Primary Care Physician:  Reford Canterbury, MD Primary Gastroenterologist:  Brigitte Canard, PA-C / Darol Elizabeth, MD  Reason for Consultation:     Gallstones        HPI:   [name] is a 33 y.o. y/o female referred for consultation & management  by Reford Canterbury, MD.    New patient.  [facility] from [facility] to evaluate cholelithiasis.  Patient was recently evaluated by her GYN for pelvic pain.  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  Cholelithiasis incidentally noted.  Current symptoms: Patient reports generalized discomfort with abdominal pressure all along her entire upper abdomen.  Located in the epigastrium, RUQ, and LUQ.  She has nausea but no vomiting.  Feels bloated and complains of weight gain.  Has mild constipation with bowel movement every 2 or 3 days.  Tried fiber Gummies which helps.  She has taken MiraLAX in the past but not recently.  Denies melena or hematochezia.  She has not had any episodes of severe RUQ pain.  No previous GI evaluation.  PMH: ADHD, anxiety  Family history: Maternal uncle with colon cancer after age 60.  Past Medical History:  Diagnosis Date   Anxiety    Asthma    Depression    GERD (gastroesophageal reflux disease)     Past Surgical History:  Procedure Laterality Date   ELBOW SURGERY  2003   HAND SURGERY  2014    Prior to Admission medications   Medication Sig Start Date End Date Taking? Authorizing Provider  amphetamine-dextroamphetamine (ADDERALL XR) 30 MG 24 hr capsule Take 30 mg by mouth daily.  01/28/16   [provider]  amphetamine-dextroamphetamine (ADDERALL) 30 MG tablet Take 30 mg by mouth 2 (two) times daily.  02/26/18   [provider]  azithromycin  (ZITHROMAX ) 250 MG tablet Take 2  tabs PO x 1 dose, then 1 tab PO QD x 4 days 01/21/23   Buena Carmine, NP  clonazePAM (KLONOPIN) 1 MG tablet Take 1 mg by mouth 3 (three) times daily as needed.     [provider]  Norethindrone Acetate-Ethinyl Estrad-FE (BLISOVI 24 FE) 1-20 MG-MCG(24) tablet Take 1 tablet by mouth daily.  12/29/17   [provider]  predniSONE  (DELTASONE ) 20 MG tablet Take 2 tablets (40 mg total) by mouth daily with breakfast. 01/21/23   Buena Carmine, NP  promethazine -dextromethorphan (PROMETHAZINE -DM) 6.25-15 MG/5ML syrup Take 5 mLs by mouth 4 (four) times daily as needed for cough. 01/21/23   Buena Carmine, NP    Family History  Problem Relation Age of Onset   Arthritis Father    Depression Father    Diabetes Father    Anxiety disorder Father    Cancer Paternal Grandfather        prostate      Social History   Tobacco Use   Smoking status: Every Day    Current packs/day: 0.25    Average packs/day: 0.3 packs/day for 12.0 years (3.0 ttl pk-yrs)    Types: Cigarettes   Smokeless tobacco: Never  Vaping Use   Vaping status: Never Used  Substance Use Topics   Alcohol use: Yes    Comment: once a week, 1-2 drinks   Drug use: Never    Allergies as  of 11/15/2023 - Review Complete 11/15/2023  Allergen Reaction Noted   Codeine Hives and Nausea And Vomiting 07/15/2014    Review of Systems:    All systems reviewed and negative except where noted in HPI.   Physical Exam:  BP 112/68   Pulse 87   Ht 5' 2.75" (1.594 m)   Wt 170 lb 11.2 oz (77.4 kg)   BMI 30.48 kg/m  No LMP recorded.  General:   Alert,  Well-developed, well-nourished, pleasant and cooperative in NAD Lungs:  Respirations even and unlabored.  Clear throughout to auscultation.   No wheezes, crackles, or rhonchi. No acute distress. Heart:  Regular rate and rhythm; no murmurs, clicks, rubs, or gallops. Abdomen:  Normal bowel sounds.  No bruits.  Soft, and non-distended without masses, hepatosplenomegaly or  hernias noted.  Mild Tenderness in the Epigastrium, LUQ, and RUQ.  Mild RLQ and LLQ tenderness.  No guarding or rebound tenderness.    Neurologic:  Alert and oriented x3;  grossly normal neurologically. Psych:  Alert and cooperative. Normal mood and affect.  Imaging Studies: No results found.  Labs: CBC    Component Value Date/Time   WBC 8.1 11/15/2023 1516   RBC 4.98 11/15/2023 1516   HGB 14.4 11/15/2023 1516   HGB 14.1 07/10/2013 1911   HCT 43.3 11/15/2023 1516   HCT 43.0 07/10/2013 1911   PLT 265.0 11/15/2023 1516   PLT 225 07/10/2013 1911   MCV 86.9 11/15/2023 1516   MCV 84 07/10/2013 1911   MCH 27.7 07/10/2013 1911   MCHC 33.3 11/15/2023 1516   RDW 14.1 11/15/2023 1516   RDW 14.7 (H) 07/10/2013 1911   LYMPHSABS 1.7 11/15/2023 1516   MONOABS 0.6 11/15/2023 1516   EOSABS 0.1 11/15/2023 1516   BASOSABS 0.1 11/15/2023 1516    CMP     Component Value Date/Time   NA 138 11/15/2023 1516   NA 139 07/10/2013 1911   K 3.7 11/15/2023 1516   K 3.7 07/10/2013 1911   CL 105 11/15/2023 1516   CL 107 07/10/2013 1911   CO2 27 11/15/2023 1516   CO2 26 07/10/2013 1911   GLUCOSE 93 11/15/2023 1516   GLUCOSE 98 07/10/2013 1911   BUN 7 11/15/2023 1516   BUN 15 07/10/2013 1911   CREATININE 0.56 11/15/2023 1516   CREATININE 0.70 07/10/2013 1911   CALCIUM 9.3 11/15/2023 1516   CALCIUM 9.1 07/10/2013 1911   PROT 7.2 11/15/2023 1516   PROT 7.1 07/10/2013 1911   ALBUMIN 4.3 11/15/2023 1516   ALBUMIN 3.4 07/10/2013 1911   AST 15 11/15/2023 1516   AST 20 07/10/2013 1911   ALT 15 11/15/2023 1516   ALT 22 07/10/2013 1911   ALKPHOS 53 11/15/2023 1516   ALKPHOS 55 07/10/2013 1911   BILITOT 0.4 11/15/2023 1516   BILITOT 0.2 07/10/2013 1911   GFRNONAA >60 07/10/2013 1911   GFRAA >60 07/10/2013 1911    Assessment and Plan:   [name] is a 33 y.o. y/o female has been referred for:   1.  Cholelithiasis; I am not convinced that her upper GI symptoms are due to cholelithiasis,  based on her symptoms.  I am ordering more tests to further evaluate upper abdominal pain, before considering referral to a surgeon to discuss cholecystectomy. - Labs: CBC, CMP, Lipase - RUQ US  - Low Fat Diet  2.  Nausea without Vomiting - Schedule EGD - Evaluate for PUD, Gastritis, H. Pylori.  3. Upper Abdominal Pain: Epigastric, LUQ, RUQ; Mild discomfort. -  Scheduling EGD with Dr. Cherryl Corona. I discussed risks of EGD with patient to include risk of bleeding, perforation, and risk of sedation.  Patient expressed understanding and agrees to proceed with EGD.   4.  Constipation -For constipation: Start OTC Miralax Powder Mix 1 capful in 6 to 8 ounces of a drink once daily Recommend high-fiber diet, 30 g of fiber daily Eat fruits, vegetables, and whole grains Drink 64 ounces of water / fluids daily.   Follow up 4 weeks after EGD with TG.  Brigitte Canard, PA-C

## 2023-11-15 ENCOUNTER — Ambulatory Visit: Payer: Self-pay | Admitting: Physician Assistant

## 2023-11-15 ENCOUNTER — Ambulatory Visit (INDEPENDENT_AMBULATORY_CARE_PROVIDER_SITE_OTHER): Admitting: Physician Assistant

## 2023-11-15 ENCOUNTER — Other Ambulatory Visit (INDEPENDENT_AMBULATORY_CARE_PROVIDER_SITE_OTHER)

## 2023-11-15 ENCOUNTER — Encounter: Payer: Self-pay | Admitting: Physician Assistant

## 2023-11-15 VITALS — BP 112/68 | HR 87 | Ht 62.75 in | Wt 170.7 lb

## 2023-11-15 DIAGNOSIS — R1012 Left upper quadrant pain: Secondary | ICD-10-CM

## 2023-11-15 DIAGNOSIS — R110 Nausea: Secondary | ICD-10-CM

## 2023-11-15 DIAGNOSIS — R1013 Epigastric pain: Secondary | ICD-10-CM | POA: Diagnosis not present

## 2023-11-15 DIAGNOSIS — R1011 Right upper quadrant pain: Secondary | ICD-10-CM

## 2023-11-15 DIAGNOSIS — K8020 Calculus of gallbladder without cholecystitis without obstruction: Secondary | ICD-10-CM | POA: Diagnosis not present

## 2023-11-15 DIAGNOSIS — K5900 Constipation, unspecified: Secondary | ICD-10-CM

## 2023-11-15 LAB — COMPREHENSIVE METABOLIC PANEL WITH GFR
ALT: 15 U/L (ref 0–35)
AST: 15 U/L (ref 0–37)
Albumin: 4.3 g/dL (ref 3.5–5.2)
Alkaline Phosphatase: 53 U/L (ref 39–117)
BUN: 7 mg/dL (ref 6–23)
CO2: 27 meq/L (ref 19–32)
Calcium: 9.3 mg/dL (ref 8.4–10.5)
Chloride: 105 meq/L (ref 96–112)
Creatinine, Ser: 0.56 mg/dL (ref 0.40–1.20)
GFR: 119.87 mL/min (ref >60.00)
Glucose, Bld: 93 mg/dL (ref 70–99)
Potassium: 3.7 meq/L (ref 3.5–5.1)
Sodium: 138 meq/L (ref 135–145)
Total Bilirubin: 0.4 mg/dL (ref 0.2–1.2)
Total Protein: 7.2 g/dL (ref 6.0–8.3)

## 2023-11-15 LAB — CBC WITH DIFFERENTIAL/PLATELET
Basophils Absolute: 0.1 10*3/uL (ref 0.0–0.1)
Basophils Relative: 0.7 % (ref 0.0–3.0)
Eosinophils Absolute: 0.1 10*3/uL (ref 0.0–0.7)
Eosinophils Relative: 1.5 % (ref 0.0–5.0)
HCT: 43.3 % (ref 36.0–46.0)
Hemoglobin: 14.4 g/dL (ref 12.0–15.0)
Lymphocytes Relative: 20.6 % (ref 12.0–46.0)
Lymphs Abs: 1.7 10*3/uL (ref 0.7–4.0)
MCHC: 33.3 g/dL (ref 30.0–36.0)
MCV: 86.9 fl (ref 78.0–100.0)
Monocytes Absolute: 0.6 10*3/uL (ref 0.1–1.0)
Monocytes Relative: 8 % (ref 3.0–12.0)
Neutro Abs: 5.6 10*3/uL (ref 1.4–7.7)
Neutrophils Relative %: 69.2 % (ref 43.0–77.0)
Platelets: 265 10*3/uL (ref 150.0–400.0)
RBC: 4.98 Mil/uL (ref 3.87–5.11)
RDW: 14.1 % (ref 11.5–15.5)
WBC: 8.1 10*3/uL (ref 4.0–10.5)

## 2023-11-15 LAB — LIPASE: Lipase: 15 U/L (ref 11.0–59.0)

## 2023-11-15 NOTE — Patient Instructions (Addendum)
 For constipation: Start OTC Miralax Powder Mix 1 capful in 6 to 8 ounces of a drink once daily Recommend high-fiber diet, 30 g of fiber daily Eat fruits, vegetables, and whole grains Drink 64 ounces of water / fluids daily.   You have been scheduled for an abdominal ultrasound at [facility] (1st floor of hospital) on 11/17/23 at 8:00 am. Please arrive 30 minutes prior to your appointment for registration. Make certain not to have anything to eat or drink after midnight prior to your appointment. Should you need to reschedule your appointment, please contact radiology at [phone]. This test typically takes about 30 minutes to perform.  Your provider has requested that you go to the basement level for lab work before leaving today. Press "B" on the elevator. The lab is located at the first door on the left as you exit the elevator.  You have been scheduled for an Endoscopy. Please follow written instructions given to you at your visit today.  If you use inhalers (even only as needed), please bring them with you on the day of your procedure.  If you take any of the following medications, they will need to be adjusted prior to your procedure:   DO NOT TAKE 7 DAYS PRIOR TO TEST- Trulicity (dulaglutide) Ozempic, Wegovy (semaglutide) Mounjaro (tirzepatide) Bydureon Bcise (exanatide extended release)  DO NOT TAKE 1 DAY PRIOR TO YOUR TEST Rybelsus (semaglutide) Adlyxin (lixisenatide) Victoza (liraglutide) Byetta (exanatide)  Thank you for trusting me with your gastrointestinal care!   Brigitte Canard, PA-C ___________________________________________________________________________  Please follow up sooner if symptoms increase or worsen  Due to recent changes in healthcare laws, you may see the results of your imaging and laboratory studies on MyChart before your provider has had a chance to review them.  We understand that in some cases there may be results that are confusing or  concerning to you. Not all laboratory results come back in the same time frame and the provider may be waiting for multiple results in order to interpret others.  Please give us  48 hours in order for your provider to thoroughly review all the results before contacting the office for clarification of your results.   _______________________________________________________  If your blood pressure at your visit was 140/90 or greater, please contact your primary care physician to follow up on this.  _______________________________________________________  If you are age 34 or older, your body mass index should be between 23-30. Your Body mass index is 30.48 kg/m. If this is out of the aforementioned range listed, please consider follow up with your Primary Care Provider.  If you are age 75 or younger, your body mass index should be between 19-25. Your Body mass index is 30.48 kg/m. If this is out of the aformentioned range listed, please consider follow up with your Primary Care Provider.   ________________________________________________________  The [facility] providers would like to encourage you to use MYCHART to communicate with providers for non-urgent requests or questions.  Due to long hold times on the telephone, sending your provider a message by [facility] may be a faster and more efficient way to get a response.  Please allow 48 business hours for a response.  Please remember that this is for non-urgent requests.  _______________________________________________________

## 2023-11-17 ENCOUNTER — Ambulatory Visit (HOSPITAL_COMMUNITY)
Admission: RE | Admit: 2023-11-17 | Discharge: 2023-11-17 | Disposition: A | Discharge status: Home / Self Care | Source: Ambulatory Visit | Attending: Physician Assistant | Admitting: Physician Assistant

## 2023-11-17 DIAGNOSIS — K8020 Calculus of gallbladder without cholecystitis without obstruction: Secondary | ICD-10-CM | POA: Insufficient documentation

## 2023-11-17 DIAGNOSIS — R1011 Right upper quadrant pain: Secondary | ICD-10-CM | POA: Insufficient documentation

## 2023-11-25 ENCOUNTER — Other Ambulatory Visit: Payer: Self-pay | Admitting: Physician Assistant

## 2023-11-25 ENCOUNTER — Telehealth: Payer: Self-pay | Admitting: Gastroenterology

## 2023-11-25 ENCOUNTER — Telehealth: Payer: Self-pay | Admitting: Physician Assistant

## 2023-11-25 MED ORDER — PANTOPRAZOLE SODIUM 40 MG PO TBEC: 40.0000 mg | DELAYED_RELEASE_TABLET | Freq: Every day | ORAL | 1 refills | Status: DC

## 2023-11-25 NOTE — Telephone Encounter (Signed)
 Inbound call from patient, states she spoke to her insurance and they said someone needs to contact them and state why the procedure needs to be held.

## 2023-11-25 NOTE — Telephone Encounter (Signed)
 Inbound call from patient stating she received letter from [facility] stating they will not cover 6/16 EGD. Patient is requesting a call to discuss a accurate estimate of out of pocket cost and other alternatives. Patient requesting a call today due to procedure being on Monday 6/16. Please advise, thank you.

## 2023-11-25 NOTE — Telephone Encounter (Signed)
 This pt's egd was denied by [employer] due to the rationale below, a peer to peer can be done if necessary to overturn the decision  An EGD can be done for one of these reasons.  You failed a four-week trial of treatment with a drug that reduces your  stomach acid. This treatment should include a daily use of a proton pump  inhibitor (PPI) unless there is a history of an allergy or you are unable  to tolerate a PPI.  You failed a four-week trial with a drug to treat a stomach infection  confirmed by stool or breath test.  You have a parent, full sibling, or child who had cancer in the upper GI  (gastrointestinal) tract. The upper GI tract consists of the tube through  which food travels from your mouth to your stomach, and the first part of  your small bowel.  One of the other reasons listed in the guideline. The notes sent to us   do not describe one of these reasons for testing. This finding was based on [employer] Gastrointestinal Endoscopic Procedure  Guidelines - Esophagogastroduodenoscopy (EGD) Section(s): Dyspepsia/Upper  Abdominal Symptoms (EGD-1.1). All ancillary procedure codes, including Monitored Anesthesia, performed  in conjunction with the above denied services, are also not covered and  will likely not be reimbursed by [employer] if performed. Participating in-network providers/facilities cannot bill you for services  your plan does not cover, unless you agreed to pay for the services in  writing before you received them.  Peer to peer: [phone] Opt 1

## 2023-11-25 NOTE — Telephone Encounter (Signed)
 Spoke to patient and advised that insurance will not cover endoscopy until she has tried and failed at least 1 month PPI. Pantoprazole 40 mg sent to patient's pharmacy for her to take once daily 30 minutes before breakfast. Patient is to let us  know if she continues having abdominal pain despite 30 days of PPI. EGD cancelled. Patient verbalizes understanding.

## 2023-11-28 ENCOUNTER — Encounter: Admitting: Gastroenterology

## 2023-12-04 NOTE — Progress Notes (Signed)
 Agree with the assessment and plan as outlined by Brigitte Canard, PA-C.

## 2023-12-11 ENCOUNTER — Telehealth: Payer: Self-pay | Admitting: Nurse Practitioner

## 2023-12-11 NOTE — Telephone Encounter (Signed)
 Patient called answering service around 4:30 pm about swelling feet after starting pantoprazole   I called patient back.  Patient recently saw Ellouise Console, P.A. in the office.  She was prescribed pantoprazole  for upper abdominal pain.  She started the omeprazole on 6/15.  Today she woke up with significant amount of swelling in her lower extremities/feet.  Never had this before. No shortness of breath.  She has not started any other medications recently.  She did take 2 Advil yesterday but does not generally take Advil.  She already watches sodium intake.   Orvella did not notice any improvement in symptoms with pantoprazole  but says her insurance company wants her to try it for 30 days before approving an EGD.   Recommendations: No more Advil/NSAIDs for now as they can contribute to fluid retention ( and epigastric pain).  Continue low-sodium diet.  Elevate feet above heart over next couple of days.  If  swelling in lower extremities not improving, or getting worse over the next 1 to 2 days then advised to see PCP.  Stop pantoprazole .  Recommend 30-day trial of famotidine 20 mg every morning. I will send this message to Ellouise Console P.A so she can decide on follow up for this patient

## 2023-12-12 NOTE — Telephone Encounter (Signed)
 Patient is advised of [name]'s recommendation to discontinue pantoprazole , take famotidine 20 mg daily x 30 days and avoid NSAIDs. Patient states she has already done this and has famotidine on hand. States her swelling has improved very much though is still somewhat present. She will follow with PCP if she continues to have swelling.

## 2023-12-20 ENCOUNTER — Emergency Department
Admission: EM | Admit: 2023-12-20 | Discharge: 2023-12-20 | Disposition: A | Discharge status: Home / Self Care | Attending: Emergency Medicine | Admitting: Emergency Medicine

## 2023-12-20 ENCOUNTER — Encounter: Payer: Self-pay | Admitting: Emergency Medicine

## 2023-12-20 ENCOUNTER — Emergency Department

## 2023-12-20 ENCOUNTER — Ambulatory Visit: Payer: Self-pay

## 2023-12-20 ENCOUNTER — Ambulatory Visit
Admission: RE | Admit: 2023-12-20 | Discharge: 2023-12-20 | Disposition: A | Discharge status: Home / Self Care | Source: Ambulatory Visit | Attending: Emergency Medicine | Admitting: Emergency Medicine

## 2023-12-20 VITALS — BP 125/89 | HR 107 | Temp 97.8°F | Resp 18

## 2023-12-20 DIAGNOSIS — J45909 Unspecified asthma, uncomplicated: Secondary | ICD-10-CM | POA: Diagnosis not present

## 2023-12-20 DIAGNOSIS — R000 Tachycardia, unspecified: Secondary | ICD-10-CM | POA: Diagnosis not present

## 2023-12-20 DIAGNOSIS — M7989 Other specified soft tissue disorders: Secondary | ICD-10-CM | POA: Diagnosis present

## 2023-12-20 DIAGNOSIS — R600 Localized edema: Secondary | ICD-10-CM | POA: Insufficient documentation

## 2023-12-20 DIAGNOSIS — E876 Hypokalemia: Secondary | ICD-10-CM | POA: Diagnosis not present

## 2023-12-20 HISTORY — DX: Calculus of gallbladder without cholecystitis without obstruction: K80.20

## 2023-12-20 LAB — BASIC METABOLIC PANEL WITH GFR
Anion gap: 13 (ref 5–15)
BUN: <5 mg/dL — ABNORMAL LOW (ref 6–20)
CO2: 22 mmol/L (ref 22–32)
Calcium: 9.7 mg/dL (ref 8.9–10.3)
Chloride: 101 mmol/L (ref 98–111)
Creatinine, Ser: 0.46 mg/dL (ref 0.44–1.00)
GFR, Estimated: >60 mL/min
Glucose, Bld: 98 mg/dL (ref 70–99)
Potassium: 3.3 mmol/L — ABNORMAL LOW (ref 3.5–5.1)
Sodium: 136 mmol/L (ref 135–145)

## 2023-12-20 LAB — CBC
HCT: 44.1 % (ref 36.0–46.0)
Hemoglobin: 14.4 g/dL (ref 12.0–15.0)
MCH: 28.7 pg (ref 26.0–34.0)
MCHC: 32.7 g/dL (ref 30.0–36.0)
MCV: 87.8 fL (ref 80.0–100.0)
Platelets: 311 K/uL (ref 150–400)
RBC: 5.02 MIL/uL (ref 3.87–5.11)
RDW: 13.1 % (ref 11.5–15.5)
WBC: 11.2 K/uL — ABNORMAL HIGH (ref 4.0–10.5)
nRBC: 0 % (ref 0.0–0.2)

## 2023-12-20 LAB — POC URINE PREG, ED: Preg Test, Ur: NEGATIVE

## 2023-12-20 LAB — TROPONIN I (HIGH SENSITIVITY): Troponin I (High Sensitivity): 3 ng/L (ref <18)

## 2023-12-20 MED ORDER — HYDROCHLOROTHIAZIDE 12.5 MG PO TABS: 12.5000 mg | ORAL_TABLET | Freq: Every day | ORAL | 0 refills | Status: DC | PRN

## 2023-12-20 NOTE — ED Triage Notes (Signed)
 Pt reports bilateral upper and lower extremity swelling since 12/12/23. Pt sent to ED today after EKG irregular and pt tachycardiac. Pt denies previous cardiac hx. Pt denies SOB and denies chest pain.

## 2023-12-20 NOTE — Discharge Instructions (Addendum)
 Go to the emergency department for evaluation of your fast heart rate and the swelling in your legs, feet, hands.

## 2023-12-20 NOTE — Telephone Encounter (Signed)
   Copied from CRM [id number]. Topic: Clinical - Red Word Triage >> Dec 20, 2023 10:46 AM Corin V wrote: [facility] that prompted transfer to Nurse Triage: Patient has fluid retention and swelling in hands, legs, and feet.  Extremities are tight and stiff. Reason for Disposition  [1] MILD swelling of both ankles (i.e., pedal edema) AND [2] new-onset or worsening  Answer Assessment - Initial Assessment Questions 1. ONSET: When did the swelling start? (e.g., minutes, hours, days)     One week ago on Saturday  2. LOCATION: What part of the leg is swollen?  Are both legs swollen or just one leg?     Bilateral feet to calf 3. SEVERITY: How bad is the swelling? (e.g., localized; mild, moderate, severe)   - Localized: Small area of swelling localized to one leg.   - MILD pedal edema: Swelling limited to foot and ankle, pitting edema < 1/4 inch (6 mm) deep, rest and elevation eliminate most or all swelling.   - MODERATE edema: Swelling of lower leg to knee, pitting edema > 1/4 inch (6 mm) deep, rest and elevation only partially reduce swelling.   - SEVERE edema: Swelling extends above knee, facial or hand swelling present.      moderate 4. REDNESS: Does the swelling look red or infected?     Denies  5. PAIN: Is the swelling painful to touch? If Yes, ask: How painful is it?   (Scale 1-10; mild, moderate or severe)     Denies  6. FEVER: Do you have a fever? If Yes, ask: What is it, how was it measured, and when did it start?      denies 7. CAUSE: What do you think is causing the leg swelling?     Unsure 8. MEDICAL HISTORY: Do you have a history of blood clots (e.g., DVT), cancer, heart failure, kidney disease, or liver failure?     Denies  9. RECURRENT SYMPTOM: Have you had leg swelling before? If Yes, ask: When was the last time? What happened that time?     Occasional finger swelling when hot 10. OTHER SYMPTOMS: Do you have any other symptoms? (e.g., chest pain,  difficulty breathing)       Hand swelling  Additional info: Patient has not been to practice in >3 years, schedule new patient appointment to reestablish care. Advised urgent care evaluation for acute edema, patient is in agreement.  Protocols used: Leg Swelling and Edema-A-AH

## 2023-12-20 NOTE — ED Notes (Signed)
 Per Triplett FNP, pt does not need repeat troponin prior to discharge. Troponin discontinued.

## 2023-12-20 NOTE — ED Provider Notes (Signed)
 [facility] Provider Note    Event Date/Time   First MD Initiated Contact with Patient 12/20/23 2026     (approximate)   History   Leg Swelling and Tachycardia   HPI  LILYONA RICHNER is a 33 y.o. female with history of asthma, depression, GERD, anxiety and as listed in EMR presents to the emergency department for treatment and evaluation of bilateral upper and lower extremity swelling that started on 12/12/2023.  Symptoms started after  starting pantoprazole .  She was evaluated by primary care and advised to stop taking it due to significant amount of lower extremity swelling shortly after starting it.  She was also advised not to take any more NSAIDs.  Patient states that she has not taken either of these since the 30th.  Lower extremity edema has gradually improved.  She notices increase in swelling in the evening that is better upon awakening in the mornings.  She became concerned today because she also noticed swelling in both hands.  She went to urgent care and was advised to come to the emergency department due to swelling and tachycardia.  She denies chest pain, shortness of breath, palpitations.  She denies having had recent URI symptoms.     Physical Exam    Vitals:   12/20/23 2056 12/20/23 2107  BP:  105/79  Pulse: 90 91  Resp:    Temp:  98.5 F (36.9 C)  SpO2:  98%    General: Awake, no distress.  CV:  Good peripheral perfusion.  Resp:  Normal effort. Lungs clear. Abd:  No distention.  Other:  Nonpitting edema bilateral hands and lower extremities   ED Results / Procedures / Treatments   Labs (all labs ordered are listed, but only abnormal results are displayed)  Labs Reviewed  BASIC METABOLIC PANEL WITH GFR - Abnormal; Notable for the following components:      Result Value   Potassium 3.3 (*)    BUN <5 (*)    All other components within normal limits  CBC - Abnormal; Notable for the following components:   WBC 11.2 (*)    All  other components within normal limits  POC URINE PREG, ED  TROPONIN I (HIGH SENSITIVITY)     EKG  Sinus tachycardia with a rate of 107   RADIOLOGY  Image and radiology report reviewed and interpreted by me. Radiology report consistent with the same.  Chest x-ray negative for acute cardiopulmonary abnormality.  PROCEDURES:  Critical Care performed: No  Procedures   MEDICATIONS ORDERED IN ED:  Medications - No data to display   IMPRESSION / MDM / ASSESSMENT AND PLAN / ED COURSE   I have reviewed the triage note and vital signs. Vital signs reassuring.  She is slightly tachycardic with a rate of 102.   Differential diagnosis includes, but is not limited to, cardiac arrhythmia, congestive heart failure, cardiomegaly, medication adverse effect  Patient's presentation is most consistent with acute illness / injury with system symptoms.  33 year old female presenting to the emergency department for treatment and evaluation of peripheral edema that has been present since 12/12/2023.  It was initially thought to be related to either pantoprazole  or Advil.  She has not taken either of these since that time.  Although her lower extremity edema has been improving, she noticed that her hands felt swollen today.  On my exam, there is no pitting edema and I am unable to appreciate significant swelling of ankles or hands however the patient  reports that her hands feel tight.  Labs show mild hypokalemia at 3.3 but otherwise normal BMP.  CBC is very slightly elevated at 11.2 without a left shift.  Troponin is 3.  Urine pregnancy test is negative.  Chest x-ray is without acute cardiopulmonary abnormality.  EKG shows a sinus tachycardia with a rate of 107 and without ectopy.  Results reviewed with the patient and family.  Plan will be to treat with low-dose hydrochlorothiazide  to help with swelling and have her follow-up with her primary care provider for specific cause of peripheral edema.   No emergent condition noted tonight.  She was advised that if she develops additional symptoms of concern such as shortness of breath, chest pain, palpitations to come back to the emergency department.  She was agreeable to this plan.      FINAL CLINICAL IMPRESSION(S) / ED DIAGNOSES   Final diagnoses:  Peripheral edema     Rx / DC Orders   ED Discharge Orders          Ordered    hydrochlorothiazide  (HYDRODIURIL ) 12.5 MG tablet  Daily PRN        12/20/23 2101    Ambulatory Referral to Primary Care (Establish Care)       Comments: Peripheral edema and general medical care   12/20/23 2102             Note:  This document was prepared using Dragon voice recognition software and may include unintentional dictation errors.   Herlinda Kirk NOVAK, FNP 12/20/23 2302    Malvina Alm DASEN, MD 12/20/23 [phone]

## 2023-12-20 NOTE — ED Notes (Signed)
 Pt presented to ED with c/o leg swelling and tachycardia. States leg swelling started a week ago and has improved but is still present. Pt states she went to UC and EKG was preformed, showing sinus tachycardia rate 116. Pt states recently stopped taken metformin and protonix  on 6/28 due to swelling. Denies chest pain, shortness of breath. States 2 episodes of vomiting over the past week. Denies nausea at this time. Family at bedside.

## 2023-12-20 NOTE — Discharge Instructions (Signed)
 Return to the emergency department if you develop any chest pain, shortness of breath, increase in peripheral edema or other symptoms of concern if unable to see primary care.

## 2023-12-20 NOTE — ED Triage Notes (Addendum)
 Patient to Urgent Care with complaints of bilateral feet swelling(worse left)- extends to calves and bilateral hand swelling (worse in right). Denies any pain.   Symptoms started last Saturday. States she had been taking pantoprazole . Has stopped this and seen some improvement.

## 2023-12-20 NOTE — ED Notes (Signed)
 Patient is being discharged from the Urgent Care and sent to the Emergency Department via POV . Per Burnard Cork, patient is in need of higher level of care due to tachycardia/ lower extremity edema. Patient is aware and verbalizes understanding of plan of care.  Vitals:   12/20/23 1736  BP: 125/89  Pulse: (!) 107  Resp: 18  Temp: 97.8 F (36.6 C)  SpO2: 98%

## 2023-12-20 NOTE — ED Provider Notes (Signed)
 [name]    CSN: [id number] Arrival date & time: 12/20/23  1720      History   Chief Complaint Chief Complaint  Patient presents with   Leg Swelling    Swelling of hands, legs, and feet. - Entered by patient    HPI [name] is a 33 y.o. female.  Patient presents with bilateral hand and lower extremity swelling x approximately 10 days.  She started pantoprazole  on 11/28/2023 which was prescribed by her gastroenterologist.  She contacted her GI and was instructed to stop the pantoprazole  due to her swelling.  Her swelling has improved.  She denies chest pain, shortness of breath, cough, abdominal pain, numbness, weakness, paresthesias.  Patient had blood work done by her GI on 11/15/2023; normal CBC and CMP.  Patient contacted her PCPs office today and was told to come to the urgent care.  She had unfortunately not seen her PCP in more than 3 years so had to schedule an appointment to reestablish; the appointment is 03/15/2024.  The history is provided by the patient and medical records.    Past Medical History:  Diagnosis Date   Anxiety    Asthma    Depression    Gall stones    GERD (gastroesophageal reflux disease)     Patient Active Problem List   Diagnosis Date Noted   Tobacco abuse 05/09/2018   ADHD 05/09/2018   Weight gain 05/09/2018    Past Surgical History:  Procedure Laterality Date   ELBOW SURGERY  2003   HAND SURGERY  2014    OB History   No obstetric history on file.      Home Medications    Prior to Admission medications   Medication Sig Start Date End Date Taking? Authorizing Provider  amphetamine-dextroamphetamine (ADDERALL) 30 MG tablet Take 30 mg by mouth 2 (two) times daily.  02/26/18   [provider]  clonazePAM (KLONOPIN) 1 MG tablet Take 1 mg by mouth 3 (three) times daily as needed.     [provider]  metFORMIN (GLUCOPHAGE) 500 MG tablet Take 500 mg by mouth 2 (two) times daily with a meal.    [provider]  pantoprazole  (PROTONIX ) 40 MG tablet TAKE 1 TABLET(40 MG) BY MOUTH DAILY 30 MINUTES BEFORE BREAKFAST Patient not taking: Reported on 12/20/2023 11/28/23   [place], PA-C    Family History Family History  Problem Relation Age of Onset   Arthritis Father    Depression Father    Diabetes Father    Anxiety disorder Father    Cancer Paternal Grandfather        prostate     Social History Social History   Tobacco Use   Smoking status: Former    Current packs/day: 0.25    Average packs/day: 0.3 packs/day for 12.0 years (3.0 ttl pk-yrs)    Types: Cigarettes   Smokeless tobacco: Never  Vaping Use   Vaping status: Every Day  Substance Use Topics   Alcohol use: Yes    Comment: once a week, 1-2 drinks   Drug use: Never     Allergies   Codeine and Pantoprazole    Review of Systems Review of Systems  Constitutional:  Negative for chills and fever.  Respiratory:  Negative for cough and shortness of breath.   Cardiovascular:  Positive for leg swelling. Negative for chest pain and palpitations.  Musculoskeletal:  Negative for arthralgias and gait problem.  Skin:  Negative for color change, rash and wound.  Neurological:  Negative for dizziness, weakness and numbness.     Physical Exam Triage Vital Signs ED Triage Vitals  Encounter Vitals Group     BP 12/20/23 1736 125/89     Girls Systolic BP Percentile --      Girls Diastolic BP Percentile --      Boys Systolic BP Percentile --      Boys Diastolic BP Percentile --      Pulse Rate 12/20/23 1736 (!) 107     Resp 12/20/23 1736 18     Temp 12/20/23 1736 97.8 F (36.6 C)     Temp src --      SpO2 12/20/23 1736 98 %     Weight --      Height --      Head Circumference --      Peak Flow --      Pain Score 12/20/23 1735 0     Pain Loc --      Pain Education --      Exclude from Growth Chart --    No data found.  Updated Vital Signs BP 125/89   Pulse (!) 107   Temp 97.8 F (36.6 C)   Resp 18    LMP 12/04/2023   SpO2 98%   Visual Acuity Right Eye Distance:   Left Eye Distance:   Bilateral Distance:    Right Eye Near:   Left Eye Near:    Bilateral Near:     Physical Exam Constitutional:      General: She is not in acute distress. HENT:     Mouth/Throat:     Mouth: Mucous membranes are moist.  Cardiovascular:     Rate and Rhythm: Normal rate and regular rhythm.     Heart sounds: Normal heart sounds.  Pulmonary:     Effort: Pulmonary effort is normal. No respiratory distress.     Breath sounds: Normal breath sounds.  Musculoskeletal:        General: No deformity. Normal range of motion.     Right lower leg: Edema present.     Left lower leg: Edema present.     Comments: 1+ slightly pitting bilateral pedal edema.  No calf tenderness.  Skin:    General: Skin is warm and dry.     Capillary Refill: Capillary refill takes less than 2 seconds.     Findings: No bruising, erythema, lesion or rash.  Neurological:     General: No focal deficit present.     Mental Status: She is alert.     Sensory: No sensory deficit.     Motor: No weakness.     Gait: Gait normal.      UC Treatments / Results  Labs (all labs ordered are listed, but only abnormal results are displayed) Labs Reviewed - No data to display  EKG   Radiology No results found.  Procedures Procedures (including critical care time)  Medications Ordered in UC Medications - No data to display  Initial Impression / Assessment and Plan / UC Course  I have reviewed the triage vital signs and the nursing notes.  Pertinent labs & imaging results that were available during my care of the patient were reviewed by me and considered in my medical decision making (see chart for details).    Bilateral lower extremity edema, hand edema, tachycardia.   EKG shows sinus tachycardia, rate 116, no ST elevation, no previous to compare.  Patient reports sudden onset of her lower extremity and hand  edema approximately  10 days ago.  The edema has improved but was previously 3-4+ pitting (picture on her phone).  Sending her to the ED for evaluation.  She is agreeable to this and will drive herself to [facility] ED.  Final Clinical Impressions(s) / UC Diagnoses   Final diagnoses:  Bilateral lower extremity edema  Hand edema  Tachycardia     Discharge Instructions      Go to the emergency department for evaluation of your fast heart rate and the swelling in your legs, feet, hands.     ED Prescriptions   None    PDMP not reviewed this encounter.   Corlis Burnard DEL, NP 12/20/23 [phone]

## 2023-12-20 NOTE — ED Notes (Signed)
 [place] FNP at bedside.

## 2024-01-10 ENCOUNTER — Encounter: Payer: Self-pay | Admitting: Physician Assistant

## 2024-01-10 ENCOUNTER — Ambulatory Visit (INDEPENDENT_AMBULATORY_CARE_PROVIDER_SITE_OTHER): Admitting: Physician Assistant

## 2024-01-10 VITALS — BP 122/87 | HR 127 | Ht 62.75 in | Wt 163.8 lb

## 2024-01-10 DIAGNOSIS — R600 Localized edema: Secondary | ICD-10-CM | POA: Diagnosis not present

## 2024-01-10 DIAGNOSIS — R000 Tachycardia, unspecified: Secondary | ICD-10-CM

## 2024-01-10 NOTE — Progress Notes (Signed)
 New patient visit   Patient: [name]   DOB: 1990-10-08   34 y.o. Female  MRN: [id number] Visit Date: 01/10/2024  Today's healthcare provider: Manuelita Flatness, PA-C   Cc. Tachycardia, peripheral edema  Subjective    [name] is a 33 y.o. female who presents today as a new patient to establish care.   Pt was seen in the ED 7/22 for lower extemity edema, determined to be dependent edema.  D/c with lasix 20 prn. B/l dopplers w/o dvt. Bnp normal. CXR negative. EKG sinus tachy otherwise normal.  Prior to this, she was seen for similar concerns at urgent care 7/8, directed to ED. Swelling at that time was upper and lower extremities, starting after pantoprazole  which she d/c.  Discussed the use of AI scribe software for clinical note transcription with the patient, who gave verbal consent to proceed.  History of Present Illness   [name] is a 33 year old female who presents with episodes of swelling in her legs and feet, and increased heart rate.  She experiences intense swelling in her legs and feet, with occasional swelling in her fingers, starting December 10, 2023  with the last episode on January 03, 2024. Elevating her feet reduces the swelling overnight but does not completely resolve it.  The swelling feels like 'jelly' when walking, causing discomfort. This has never occurred before. She has taken hydrochlorothiazide  intermittently since being in the ED. Last took hydrochlorothiazide  12.5 mg yesterday. She has not taken lasix.   Her heart rate is increased, from 100s-150s for the last month as well. She sometimes feels a 'tight hurt feeling' similar to anxiety but denies palpitations, skipped beats, or racing heart sensations. No chest pain, shortness of breath, or new cough, though she mentions a dry cough that feels like something is stuck in her throat.  She takes Klonopin and Adderall, with Adderall at 30 mg once daily, though prescribed for twice a day. She has never  experienced tachycardia on adderall before. She takes klonopin occasionally, cannot recall the last time she needed it.   She was prescribed metformin for prediabetes but has not taken it for the past month. Her periods have normalized without metformin. She has a history of ovarian cysts and gallstones but denies significant pelvic pain, abdominal pain. Intermittent reflux.   She experienced two episodes of vomiting since the onset of her symptoms,She felt fine after vomiting on both occasions.        Past Medical History:  Diagnosis Date   Anxiety    Asthma    Depression    Gall stones    GERD (gastroesophageal reflux disease)    Past Surgical History:  Procedure Laterality Date   ELBOW SURGERY  2003   HAND SURGERY  2014   Family Status  Relation Name Status   Mother Joen Searles   Father Loren Deceased       due to freak tragic accident, had TBI   Sister Corean Alive   MGM  Deceased   MGF  Deceased   PGM  Deceased   PGF  Deceased  No partnership data on file   Family History  Problem Relation Age of Onset   Arthritis Father    Depression Father    Diabetes Father    Anxiety disorder Father    Cancer Paternal Grandfather        prostate    Social History   Socioeconomic History   Marital status: Single  Spouse name: Not on file   Number of children: Not on file   Years of education: Not on file   Highest education level: Not on file  Occupational History   Not on file  Tobacco Use   Smoking status: Former    Current packs/day: 0.25    Average packs/day: 0.3 packs/day for 12.0 years (3.0 ttl pk-yrs)    Types: Cigarettes   Smokeless tobacco: Never  Vaping Use   Vaping status: Every Day  Substance and Sexual Activity   Alcohol use: Yes    Comment: once a week, 1-2 drinks   Drug use: Never   Sexual activity: Yes    Birth control/protection: Pill  Other Topics Concern   Not on file  Social History Narrative   Working on a Masters in [employer] on online program   Working at [employer] - in [employer] with boyfriend   Exercise: not currently, used to exercise a few days per week   Social Drivers of Corporate investment banker Strain: Not on file  Food Insecurity: Not on file  Transportation Needs: Not on file  Physical Activity: Not on file  Stress: Not on file  Social Connections: Not on file   Outpatient Medications Prior to Visit  Medication Sig   amphetamine-dextroamphetamine (ADDERALL) 30 MG tablet Take 30 mg by mouth 2 (two) times daily.    clonazePAM (KLONOPIN) 1 MG tablet Take 1 mg by mouth 3 (three) times daily as needed.    furosemide (LASIX) 20 MG tablet Take 20 mg by mouth daily. (Patient not taking: Reported on 01/10/2024)   hydrochlorothiazide  (HYDRODIURIL ) 12.5 MG tablet Take 1 tablet (12.5 mg total) by mouth daily as needed. (Patient not taking: Reported on 01/10/2024)   [DISCONTINUED] metFORMIN (GLUCOPHAGE) 500 MG tablet Take 500 mg by mouth 2 (two) times daily with a meal. (Patient not taking: Reported on 01/10/2024)   No facility-administered medications prior to visit.   Allergies  Allergen Reactions   Codeine Hives and Nausea And Vomiting    (liquid)     Pantoprazole  Other (See Comments)    Swelling    Immunization History  Administered Date(s) Administered   DTaP 07/18/1990, 09/22/1990, 11/24/1990, 08/17/1991, 09/26/1995   HIB (PRP-OMP) 07/18/1990, 09/22/1990, 11/24/1990, 08/17/1991   Hepatitis A 09/20/2007, 03/21/2008   Hepatitis B 03/28/2003, 05/02/2003, 10/09/2003   IPV 07/18/1990, 09/22/1990, 08/17/1991, 09/26/1995   MMR 08/17/1991, 09/26/1995   Meningococcal Conjugate 09/24/2005   PPD Test 09/19/2014   Tdap 09/24/2005    Health Maintenance  Topic Date Due   HIV Screening  Never done   Hepatitis C Screening  Never done   DTaP/Tdap/Td (7 - Td or Tdap) 09/25/2015   HPV VACCINES (1 - 3-dose SCDM series) Never done   Cervical Cancer Screening (HPV/Pap Cotest)   05/24/2020   COVID-19 Vaccine (1 - 2024-25 season) Never done   INFLUENZA VACCINE  01/13/2024   Hepatitis B Vaccines  Completed   Meningococcal B Vaccine  Aged Out    Patient Care Team: Cyndi Shaver, PA-C as PCP - General (Physician Assistant)  Review of Systems  Constitutional:  Negative for fatigue and fever.  Respiratory:  Negative for cough and shortness of breath.   Cardiovascular:  Positive for leg swelling. Negative for chest pain.       Tachycardia  Gastrointestinal:  Negative for abdominal pain.  Neurological:  Negative for dizziness and headaches.        Objective  BP 122/87   Pulse (!) 127   Ht 5' 2.75 (1.594 m)   Wt 163 lb 12.8 oz (74.3 kg)   LMP 01/01/2024 (Exact Date)   BMI 29.25 kg/m     Physical Exam Constitutional:      General: She is awake.     Appearance: She is well-developed.  HENT:     Head: Normocephalic.  Eyes:     Conjunctiva/sclera: Conjunctivae normal.  Cardiovascular:     Rate and Rhythm: Regular rhythm. Tachycardia present.     Heart sounds: Normal heart sounds.  Pulmonary:     Effort: Pulmonary effort is normal.     Breath sounds: Normal breath sounds.  Musculoskeletal:     Right lower leg: No edema.     Left lower leg: No edema.  Skin:    General: Skin is warm.  Neurological:     Mental Status: She is alert and oriented to person, place, and time.  Psychiatric:        Attention and Perception: Attention normal.        Mood and Affect: Mood normal.        Speech: Speech normal.        Behavior: Behavior is cooperative.     Depression Screen    01/10/2024    2:19 PM 05/09/2018   10:10 AM  PHQ 2/9 Scores  PHQ - 2 Score 0 1  PHQ- 9 Score  7   No results found for any visits on 01/10/24.  Assessment & Plan     Peripheral edema -     Microalbumin / creatinine urine ratio -     ECHOCARDIOGRAM COMPLETE; Future  Sinus tachycardia -     LONG TERM MONITOR (3-14 DAYS); Future -     ECHOCARDIOGRAM COMPLETE;  Future  Elevated heart rate with EKGs showing sinus tachycardia. No chest pain, shortness of breath, or significant palpitations. Today HR at rest 120s-130s.  - Order heart monitor for 3 days to assess for abnormal rhythms. - Order echocardiogram to evaluate cardiac function and rule out heart failure. - reviewed labs -Perform urine test to check for proteinuria and assess kidney function.   - Advise to record episodes of swelling and use hydrochlorothiazide  as needed, avoiding Lasix   Advised if HR > 150 and remains elevated, recommending she go to the ED     Return if symptoms worsen or fail to improve, for will f/b with test results.      Manuelita Flatness, PA-C  [facility] at [facility] [phone] (phone) [phone] (fax)  [facility]

## 2024-01-11 ENCOUNTER — Telehealth: Payer: Self-pay

## 2024-01-11 ENCOUNTER — Ambulatory Visit: Attending: Physician Assistant

## 2024-01-11 ENCOUNTER — Ambulatory Visit: Payer: Self-pay | Admitting: Physician Assistant

## 2024-01-11 DIAGNOSIS — R000 Tachycardia, unspecified: Secondary | ICD-10-CM

## 2024-01-11 LAB — MICROALBUMIN / CREATININE URINE RATIO
Creatinine,U: 15.5 mg/dL
Microalb Creat Ratio: UNDETERMINED mg/g (ref 0.0–30.0)
Microalb, Ur: <0.7 mg/dL

## 2024-01-11 NOTE — Progress Notes (Unsigned)
 EP to read.

## 2024-01-11 NOTE — Telephone Encounter (Signed)
 Copied from CRM [id number]. Topic: General - Other >> Jan 11, 2024  9:22 AM Franky GRADE wrote: Reason for CRM: Patient was seen on 01/10/2024 with Manuelita Flatness, she ordered a heart monitor for the patient to wear, patient received a notification through MyChart that she had an appointment with Cardiology today; however, when she arrived they informed her she did not have an appointment scheduled. Patient would like to know what's going on as no one has given her any information.

## 2024-01-18 ENCOUNTER — Telehealth: Payer: Self-pay | Admitting: *Deleted

## 2024-01-18 NOTE — Telephone Encounter (Signed)
 Pt.notified

## 2024-01-18 NOTE — Telephone Encounter (Signed)
 Cardiology reads the Zio patch results, right?    Copied from CRM #[id number]. Topic: Clinical - Medical Advice >> Jan 18, 2024 10:52 AM Berneda FALCON wrote: Reason for CRM: Patient states that she is a [facility] patient and recently had a heart monitor and ultrasound and would like to know who is going to review this information and what she needs to do.  Patient callback is [phone]

## 2024-01-18 NOTE — Progress Notes (Unsigned)
 Ellouise Console, PA-C [address] [place], [place]  [address] Phone: [phone]   Primary Care Physician: Cyndi Shaver, PA-C  Primary Gastroenterologist:  Ellouise Console, PA-C / Glendia Holt, MD   Chief Complaint: Follow-up abdominal pain, constipation, gallstones       HPI:   [name] is a 33 y.o. female returns for 101-month follow-up of generalized upper abdominal pain.  Pelvic ultrasound and pelvic MRI (ordered by GYN) showed 1.9 cm benign left ovarian cyst.  Incidental cholelithiasis.  She has history of constipation, ADHD, anxiety.  Maternal uncle had colon cancer after age 50.  2 months ago she was advised to take MiraLAX 1 capful in a drink every day to treat constipation.  EGD was ordered, but not yet completed.  11/17/2023 RUQ ultrasound: Showed cholelithiasis but no evidence of cholecystitis.  No liver lesions or evidence of fatty liver.  01/03/2024 labs: Normal CBC, CMP, TSH.  Hgb 14.8.  Negative urine pregnancy.  Current symptoms: She tried Protonix  40 Mg once daily which caused bilateral lower extremity swelling and she had to discontinue.  No longer on Protonix .  Her upper abdominal pain has resolved.  She is not having any GI symptoms at this time.  Occasionally takes OTC antacid.  She denies current heartburn, dysphagia, abdominal pain.  She takes OTC MiraLAX which has helped with constipation.  She has no GI concerns today.  She does not feel like she needs an EGD.  Of note, she is currently undergoing cardiac evaluation for increased heart rate and elevated blood pressure.  Current Outpatient Medications  Medication Sig Dispense Refill   amphetamine-dextroamphetamine (ADDERALL) 30 MG tablet Take 30 mg by mouth 2 (two) times daily.      clonazePAM (KLONOPIN) 1 MG tablet Take 1 mg by mouth 3 (three) times daily as needed.      furosemide (LASIX) 20 MG tablet Take 20 mg by mouth daily.     hydrochlorothiazide  (HYDRODIURIL ) 12.5 MG tablet Take 1  tablet (12.5 mg total) by mouth daily as needed. 14 tablet 0   No current facility-administered medications for this visit.    Allergies as of 01/19/2024 - Review Complete 01/19/2024  Allergen Reaction Noted   Codeine Hives and Nausea And Vomiting 07/15/2014   Pantoprazole  Other (See Comments) 12/20/2023    Past Medical History:  Diagnosis Date   Anxiety    Asthma    Depression    Gall stones    GERD (gastroesophageal reflux disease)     Past Surgical History:  Procedure Laterality Date   ELBOW SURGERY  2003   HAND SURGERY  2014    Review of Systems:    All systems reviewed and negative except where noted in HPI.    Physical Exam:  BP 118/72   Pulse (!) 106   Ht 5' 2 (1.575 m)   Wt 166 lb (75.3 kg)   LMP 01/01/2024 (Exact Date)   BMI 30.36 kg/m  Patient's last menstrual period was 01/01/2024 (exact date).  General: Well-nourished, well-developed in no acute distress.  Lungs: Clear to auscultation bilaterally. Non-labored. Heart: Regular rate and rhythm, no murmurs rubs or gallops.  Abdomen: Bowel sounds are normal; Abdomen is Soft; No hepatosplenomegaly, masses or hernias;  No Abdominal Tenderness; No guarding or rebound tenderness. Neuro: Alert and oriented x 3.  Grossly intact.  Psych: Alert and cooperative, normal mood and affect.   Imaging Studies: DG Chest 2 View Result Date: 12/20/2023 CLINICAL DATA:  EDEMA  AND SWELLING IN LOWER EXT WITH TACHYCARDIA EXAM: CHEST - 2 VIEW COMPARISON:  07/14/2010 FINDINGS: The heart size and mediastinal contours are within normal limits. Both lungs are clear. The visualized skeletal structures are unremarkable. IMPRESSION: No active cardiopulmonary disease. Electronically Signed   By: Franky Crease M.D.   On: 12/20/2023 19:44    Labs: CBC    Component Value Date/Time   WBC 11.2 (H) 12/20/2023 1911   RBC 5.02 12/20/2023 1911   HGB 14.4 12/20/2023 1911   HGB 14.1 07/10/2013 1911   HCT 44.1 12/20/2023 1911   HCT 43.0  07/10/2013 1911   PLT 311 12/20/2023 1911   PLT 225 07/10/2013 1911   MCV 87.8 12/20/2023 1911   MCV 84 07/10/2013 1911   MCH 28.7 12/20/2023 1911   MCHC 32.7 12/20/2023 1911   RDW 13.1 12/20/2023 1911   RDW 14.7 (H) 07/10/2013 1911   LYMPHSABS 1.7 11/15/2023 1516   MONOABS 0.6 11/15/2023 1516   EOSABS 0.1 11/15/2023 1516   BASOSABS 0.1 11/15/2023 1516    CMP     Component Value Date/Time   NA 136 12/20/2023 1911   NA 139 07/10/2013 1911   K 3.3 (L) 12/20/2023 1911   K 3.7 07/10/2013 1911   CL 101 12/20/2023 1911   CL 107 07/10/2013 1911   CO2 22 12/20/2023 1911   CO2 26 07/10/2013 1911   GLUCOSE 98 12/20/2023 1911   GLUCOSE 98 07/10/2013 1911   BUN <5 (L) 12/20/2023 1911   BUN 15 07/10/2013 1911   CREATININE 0.46 12/20/2023 1911   CREATININE 0.70 07/10/2013 1911   CALCIUM 9.7 12/20/2023 1911   CALCIUM 9.1 07/10/2013 1911   PROT 7.2 11/15/2023 1516   PROT 7.1 07/10/2013 1911   ALBUMIN 4.3 11/15/2023 1516   ALBUMIN 3.4 07/10/2013 1911   AST 15 11/15/2023 1516   AST 20 07/10/2013 1911   ALT 15 11/15/2023 1516   ALT 22 07/10/2013 1911   ALKPHOS 53 11/15/2023 1516   ALKPHOS 55 07/10/2013 1911   BILITOT 0.4 11/15/2023 1516   BILITOT 0.2 07/10/2013 1911   GFRNONAA >60 12/20/2023 1911   GFRNONAA >60 07/10/2013 1911   GFRAA >60 07/10/2013 1911     Assessment and Plan:   [name] is a 33 y.o. y/o female returns for follow-up of:  1.  Upper abdominal pain: Currently resolved - Cancel EGD.  Not necessary.  2.  Mild acid reflux: Currently improved - Continue OTC Tums antacid as needed. - Recommend Lifestyle Modifications to prevent Acid Reflux.  Rec. Avoid coffee, sodas, peppermint, garlic, onions, alcohol, citrus fruits, chocolate, tomatoes, fatty and spicey foods.  Avoid eating 2-3 hours before bedtime.   - Remain off pantoprazole  which caused extremity edema.  3.  Mild constipation: Controlled on MiraLAX - Continue MiraLAX as needed  4.  Cholelithiasis:  Asymptomatic. - Recommend low-fat diet. - If she develops RUQ pain, nausea, vomiting, then we can refer her to a surgeon to discuss cholecystectomy.  Not necessary at this time.  Ellouise Console, PA-C  Follow up as needed if recurrent GI symptoms.

## 2024-01-19 ENCOUNTER — Ambulatory Visit (INDEPENDENT_AMBULATORY_CARE_PROVIDER_SITE_OTHER): Admitting: Physician Assistant

## 2024-01-19 ENCOUNTER — Encounter: Payer: Self-pay | Admitting: Physician Assistant

## 2024-01-19 VITALS — BP 118/72 | HR 106 | Ht 62.0 in | Wt 166.0 lb

## 2024-01-19 DIAGNOSIS — K219 Gastro-esophageal reflux disease without esophagitis: Secondary | ICD-10-CM | POA: Diagnosis not present

## 2024-01-19 DIAGNOSIS — K5900 Constipation, unspecified: Secondary | ICD-10-CM | POA: Diagnosis not present

## 2024-01-19 DIAGNOSIS — K8020 Calculus of gallbladder without cholecystitis without obstruction: Secondary | ICD-10-CM

## 2024-01-19 DIAGNOSIS — K5901 Slow transit constipation: Secondary | ICD-10-CM

## 2024-01-19 NOTE — Patient Instructions (Signed)
 Please follow up as needed.  _______________________________________________________  If your blood pressure at your visit was 140/90 or greater, please contact your primary care physician to follow up on this.  _______________________________________________________  If you are age 34 or older, your body mass index should be between 23-30. Your Body mass index is 30.36 kg/m. If this is out of the aforementioned range listed, please consider follow up with your Primary Care Provider.  If you are age 7 or younger, your body mass index should be between 19-25. Your Body mass index is 30.36 kg/m. If this is out of the aformentioned range listed, please consider follow up with your Primary Care Provider.   ________________________________________________________  The [facility] providers would like to encourage you to use MYCHART to communicate with providers for non-urgent requests or questions.  Due to long hold times on the telephone, sending your provider a message by [facility] may be a faster and more efficient way to get a response.  Please allow 48 business hours for a response.  Please remember that this is for non-urgent requests.  _______________________________________________________  [facility] is using a team-based approach to care.  Your team is made up of your doctor and two to three APPS. Our APPS (Nurse Practitioners and Physician Assistants) work with your physician to ensure care continuity for you. They are fully qualified to address your health concerns and develop a treatment plan. They communicate directly with your gastroenterologist to care for you. Seeing the Advanced Practice Practitioners on your physician's team can help you by facilitating care more promptly, often allowing for earlier appointments, access to diagnostic testing, procedures, and other specialty referrals.

## 2024-01-27 DIAGNOSIS — R000 Tachycardia, unspecified: Secondary | ICD-10-CM

## 2024-01-31 NOTE — Progress Notes (Signed)
 Agree with the assessment and plan as outlined by Brigitte Canard, PA-C.

## 2024-02-13 NOTE — ED Provider Notes (Signed)
 [facility] Department of Emergency Medicine Patient: [name] DOB: 04-05-1991 MRN: [id number] CSN:  [id number]  PCP: Pcp None Date: 02/12/2024   ED COURSE, MEDICAL DECISION MAKING, DIFFERENTIAL   Assessment & Plan Differential diagnosis includes but not limited to: CHF, synthetic liver dysfunction, renal insufficiency, venous insufficiency, lymphedema  Patient presents with intermittent mild pedal edema.  Intermittent nature.  Has had workup previously at home including recent CMP, CBC, TSH, DVT ultrasound all of which were reassuring.  Had a MRI of her pelvis in March of this year that did not show any concerning findings for tumors or masses.  No associated chest pain or shortness of breath, slightly tachycardic but has been tachycardic on multiple visits and has planned follow-up with cardiology.  Initially discussed repeating labs however patient ultimately decided against repeat labs.  Screening EKG without concerning findings.  Safe for discharge with outpatient follow-up.  Disposition plan: Follow-up with cardiologist on 02/28/2024. Formal heart ultrasound on 02/17/2024.    Clinical Impressions as of 02/13/24 2335  Pedal edema  Tachycardia    Independent interpretation of the following test(s) in the emergency department: and EKG: See my EKG intperpretation in the EKG procedure note.   Care significantly affected by the following Social Determinants of Health: and Difficulty in access / coordinating / establishing outpatient care  -visiting from out of town     I considered the following discharge prescriptions or medication management in the emergency department: and Discussed and recommended over the counter medications.      HPI   History of Present Illness This is a female with a history of ADHD, anxiety, and depression presenting with pitting edema. She is accompanied by her husband.  She has been experiencing intermittent pitting edema since  06/2023, with no identifiable triggers. The swelling is significant, particularly in one leg, but she reports no other new symptoms. She has an upcoming appointment with a cardiologist on 02/17/2024 and a formal heart ultrasound scheduled for 02/28/2024. She reports no chest pain, shortness of breath, or infectious symptoms. However, she has been experiencing night sweats for the past week, which she attributes to a hot room temperature. She did not experience night sweats the previous night. She has made dietary changes, including reducing sodium intake and increasing water consumption. She underwent a Zio monitor test for 3 days, during which no triggers were identified, and she did not experience any swelling. She was previously prescribed hydrochlorothiazide , which provided immediate relief but did not have long-term benefits. She notes that the swelling began after starting pantoprazole , which she took for about 2 weeks before discontinuing it.    No data recorded  History was obtained from:, Patient, and Significant Other    External Records Reviewed: and Care Everywhere    Care significantly affected by the following chronic conditions: and N/A  PAST MEDICAL/SURGICAL HISTORY  Past Medical History[1] Past Surgical History[2] CURRENT MEDICATIONS   Prior to Admission medications  Not on File   ALLERGIES  Allergies[3]  PHYSICAL EXAM  Vital Signs: Triage VS:  Triage Vitals    Date and Time BP Pulse Pulse Ox HR Temp Resp SPO2 L/min O2 Delivery Recorded by  02/12/24 2353 116/69 105 -- 97 F (36.1 C) 20 95 % -- RA-room air GH      VS over most recent 72 hours: Patient Vitals for the past 72 hrs:  Temp Pulse BP Resp SpO2 O2 Delivery Weight  02/12/24 2353 97 F (36.1 C) 105 116/69 20 95 %  RA-room air 73.5 kg   Most recent VS:  Vital Signs - Last Recorded  Most recent update: 02/12/2024 11:54 PM    BP  116/69       Pulse  105       Temp  97 F (36.1 C)       Resp  20        Ht  1.575 m (62)          Wt  73.5 kg   SpO2  95%   BMI  29.63 kg/m        Physical Exam Physical Exam Cardiovascular: Tachycardia noted. Musculoskeletal: minimal pedal edema, no overlying erythema, no pretibial edema  EKG/Rhythm Strip INTERPRETATION  EKG interpretation: Sinus rhythm, rate of 96, normal axis, no ST elevation or depression, nonspecific T wave inversion, QTc 377  Rhythm Strip Interpretation : Rhythm Strip/Monitor: My Interpretation is: <<Rhythm and Rate are Required>>, Sinus Rhythm, and with a rate of 96 bpm Rhythm Strip Interpretation : Rhythm Strip/Monitor: My Interpretation is: <<Rhythm and Rate are Required>>, Sinus Tachycardia, and with a rate of 101 bpm  ED ORDERS   Orders Placed This Encounter  Procedures  . 12 Lead EKG    MEDICATIONS ADMINISTERED IN ED  Medications - No data to display  LAB RESULTS  No results found for this visit on 02/13/24.  RADIOLOGY INTERPRETATION  No results found.    PROCEDURES  Procedures    ED DISPOSITION   ED Disposition     ED Disposition  Discharged   Condition  --   Comment  [name] Charity discharge to Home/Self Care          NEW PRESCRIPTIONS AT DISCHARGE   There are no discharge medications for this patient.   ED ATTENDING PHYSICIAN    Electronically signed by:  Judy Jolee Sheller, MD 02/13/2024 11:35 PM  A professional interpreter is always offered if needed, for initial history, ongoing evaluation/updates, and to review results at time of discharge.       [1] No past medical history on file. [2] No past surgical history on file. [3] Allergies Allergen Reactions  . Codeine Hives and Nausea And Vomiting    (liquid)  (liquid)   . Pantoprazole  Swelling (Angioedema)    Swelling

## 2024-02-13 NOTE — ED Notes (Signed)
 When rn went to discharge pt, pt and family not in room. Pt did not receive education, dc paperwork, and vs.

## 2024-02-17 ENCOUNTER — Ambulatory Visit (HOSPITAL_COMMUNITY)
Admission: RE | Admit: 2024-02-17 | Discharge: 2024-02-17 | Disposition: A | Discharge status: Home / Self Care | Source: Ambulatory Visit | Attending: Physician Assistant | Admitting: Physician Assistant

## 2024-02-17 DIAGNOSIS — R000 Tachycardia, unspecified: Secondary | ICD-10-CM | POA: Diagnosis not present

## 2024-02-17 DIAGNOSIS — R600 Localized edema: Secondary | ICD-10-CM | POA: Diagnosis present

## 2024-02-17 LAB — ECHOCARDIOGRAM COMPLETE: S' Lateral: 2.7 cm

## 2024-02-23 ENCOUNTER — Ambulatory Visit: Attending: Cardiovascular Disease | Admitting: Cardiovascular Disease

## 2024-02-23 ENCOUNTER — Encounter: Payer: Self-pay | Admitting: Cardiovascular Disease

## 2024-02-23 VITALS — BP 106/80 | HR 126 | Ht 62.0 in | Wt 165.4 lb

## 2024-02-23 DIAGNOSIS — I4711 Inappropriate sinus tachycardia, so stated: Secondary | ICD-10-CM | POA: Diagnosis not present

## 2024-02-23 DIAGNOSIS — R600 Localized edema: Secondary | ICD-10-CM

## 2024-02-23 MED ORDER — FUROSEMIDE 20 MG PO TABS: 20.0000 mg | ORAL_TABLET | Freq: Every day | ORAL | 3 refills | Status: DC | PRN

## 2024-02-23 NOTE — Patient Instructions (Signed)
 Medication Instructions:  STOP the Hydrochlorothiazide   *If you need a refill on your cardiac medications before your next appointment, please call your pharmacy*  Lab Work: None ordered If you have labs (blood work) drawn today and your tests are completely normal, you will receive your results only by: MyChart Message (if you have MyChart) OR A paper copy in the mail If you have any lab test that is abnormal or we need to change your treatment, we will call you to review the results.  Testing/Procedures: None ordered  Follow-Up: At [facility], you and your health needs are our priority.  As part of our continuing mission to provide you with exceptional heart care, our providers are all part of one team.  This team includes your primary Cardiologist (physician) and Advanced Practice Providers or APPs (Physician Assistants and Nurse Practitioners) who all work together to provide you with the care you need, when you need it.  Your next appointment:   6 month(s)  Provider:   You may see Dr. Darron or one of the following Advanced Practice Providers on your designated Care Team:   Lonni Meager, NP Lesley Maffucci, PA-C Bernardino Bring, PA-C Cadence Cleveland, PA-C Tylene Lunch, NP Barnie Hila, NP    We recommend signing up for the patient portal called MyChart.  Sign up information is provided on this After Visit Summary.  MyChart is used to connect with patients for Virtual Visits (Telemedicine).  Patients are able to view lab/test results, encounter notes, upcoming appointments, etc.  Non-urgent messages can be sent to your provider as well.   To learn more about what you can do with MyChart, go to [email or web address].

## 2024-02-23 NOTE — Progress Notes (Signed)
 Cardiology Office Note   Date:  02/23/2024   ID:  LACHLYN VANDERSTELT, DOB [birth year], MRN [id number]  PCP:  Cyndi Shaver, PA-C  Cardiologist:   Deatrice Cage, MD   Chief Complaint  Patient presents with   New Patient (Initial Visit)    Tachycardia/edema c/o rapid heart beat, chest tightness, edema fingers/hands and ankles. Meds reviewed verbally with pt.      History of Present Illness: [name] is a 33 y.o. female who was referred by Dr. Watt for evaluation of peripheral edema and intermittent tachycardia. She has history of anxiety, depression, ADHD, asthma and GERD.  She reports intermittent lower extremity edema since June and she had multiple emergency room visits for this.  Venous Doppler was negative for DVT.  MRI of the pelvis in March did not show any findings of tumors or compressive lesions on the IVC.  She had a ZIO monitor done in August which showed normal sinus rhythm with an average heart rate of 94 bpm with rare PACs and rare PVCs.  No significant arrhythmia overall. Echocardiogram on September 5 showed normal LV systolic function, normal diastolic function and no significant valvular abnormalities.  Her IVC was normal in size and reactive.  I personally reviewed the echocardiogram images. I reviewed her labs done in July which showed no significant proteinuria, normal thyroid function, negative pregnancy test, normal renal function, normal liver function including albumin and normal pro BNP at 50 with normal being less than 300.  The lower extremity edema is usually worse at the end of the day and intermittent.  It usually responds to small dose of furosemide .  She complains of intermittent tachycardia and seems to have frequent sinus tachycardia.  No dizziness, syncope or presyncope.  She denies chest pain or shortness of breath  Past Medical History:  Diagnosis Date   Anxiety    Asthma    Depression    Gall stones    GERD (gastroesophageal reflux  disease)     Past Surgical History:  Procedure Laterality Date   ELBOW SURGERY  2003   HAND SURGERY  2014     Current Outpatient Medications  Medication Sig Dispense Refill   amphetamine-dextroamphetamine (ADDERALL XR) 20 MG 24 hr capsule Take 20 mg by mouth in the morning, at noon, and at bedtime.     clonazePAM (KLONOPIN) 1 MG tablet Take 1 mg by mouth 3 (three) times daily as needed.      furosemide  (LASIX ) 20 MG tablet Take 20 mg by mouth daily. (Patient taking differently: Take 20 mg by mouth daily as needed.)     hydrochlorothiazide  (HYDRODIURIL ) 12.5 MG tablet Take 1 tablet (12.5 mg total) by mouth daily as needed. (Patient not taking: Reported on 02/23/2024) 14 tablet 0   No current facility-administered medications for this visit.    Allergies:   Codeine and Pantoprazole     Social History:  The patient  reports that she has quit smoking. Her smoking use included cigarettes. She has a 3 pack-year smoking history. She has never used smokeless tobacco. She reports current alcohol use. She reports that she does not use drugs.   Family History:  The patient's family history includes Anxiety disorder in her father; Arthritis in her father; Cancer in her paternal grandfather; Depression in her father; Diabetes in her father.    ROS:  Please see the history of present illness.   Otherwise, review of systems are positive for none.   All other systems are reviewed  and negative.    PHYSICAL EXAM: VS:  BP 106/80 (BP Location: Right Arm, Patient Position: Sitting, Cuff Size: Normal)   Pulse (!) 126   Ht 5' 2 (1.575 m)   Wt 165 lb 6 oz (75 kg)   SpO2 98%   BMI 30.25 kg/m  , BMI Body mass index is 30.25 kg/m. GEN: Well nourished, well developed, in no acute distress  HEENT: normal  Neck: no JVD, carotid bruits, or masses Cardiac: RRR; no murmurs, rubs, or gallops,no edema  Respiratory:  clear to auscultation bilaterally, normal work of breathing GI: soft, nontender,  nondistended, + BS MS: no deformity or atrophy  Skin: warm and dry, no rash Neuro:  Strength and sensation are intact Psych: euthymic mood, full affect   EKG:  EKG is ordered today. The ekg ordered today demonstrates : Sinus tachycardia When compared with ECG of 20-Dec-2023 19:04, No significant change was found      Recent Labs: 11/15/2023: ALT 15 12/20/2023: BUN <5; Creatinine, Ser 0.46; Hemoglobin 14.4; Platelets 311; Potassium 3.3; Sodium 136    Lipid Panel    Component Value Date/Time   CHOL 173 05/09/2018 0904   TRIG 62.0 05/09/2018 0904   HDL 58.90 05/09/2018 0904   CHOLHDL 3 05/09/2018 0904   VLDL 12.4 05/09/2018 0904   LDLCALC 102 (H) 05/09/2018 0904      Wt Readings from Last 3 Encounters:  02/23/24 165 lb 6 oz (75 kg)  01/19/24 166 lb (75.3 kg)  01/10/24 163 lb 12.8 oz (74.3 kg)          02/23/2024    8:47 AM  PAD Screen  Previous PAD dx? No  Previous surgical procedure? No  Pain with walking? No  Feet/toe relief with dangling? No  Painful, non-healing ulcers? No  Extremities discolored? No      ASSESSMENT AND PLAN:  1.  Chronic venous insufficiency: Intermittent lower extremity edema seems to be due to chronic venous insufficiency with no evidence of cardiac, renal or liver issues.  No evidence of IVC compression.  I discussed with her the importance of leg elevation during the day and if the situation worsens, she can start using knee-high support stockings. It is reasonable to continue small dose furosemide  to be used as needed.  I also discussed with her the importance of a daily walking program or even more intense exercise.    2.  Inappropriate sinus tachycardia: She frequently has sinus tachycardia with mild palpitations.  This is likely worsened by the use of Adderall.  I discussed the possibility of using a small dose beta-blocker for symptom relief but she is usually not very symptomatic.  Will hold off for now.    Disposition:   FU with  me in 6 months  Signed,  Deatrice Cage, MD  02/23/2024 8:56 AM     [facility]

## 2024-02-28 ENCOUNTER — Ambulatory Visit: Admitting: Cardiovascular Disease

## 2024-03-12 NOTE — Progress Notes (Deleted)
     Dewey Viens T. Kaige Whistler, MD, CAQ Sports Medicine [facility] at [facility] [address] [place], [address]  Phone: [phone]  FAX: [phone]  [name] - 33 y.o. female  MRN [id number]  Date of Birth: 1990/10/09  Date: 03/15/2024  PCP: Cyndi Shaver, PA-C (Inactive)  Referral: Herlinda Kirk NOVAK, FNP  No chief complaint on file.  Subjective:   [name] is a 33 y.o. very pleasant female patient with There is no height or weight on file to calculate BMI. who presents with the following:  Discussed the use of AI scribe software for clinical note transcription with the patient, who gave verbal consent to proceed.  Patient is here as a new patient office visit.  She most recently was seen at [facility].  History is notable for Adderall XR 20 mg a day  Remote history of Klonopin 1 mg  Recently started on Lasix  20 mg a day Looks like she has been seen by multiple facilities and hospital systems for lower extremity edema History of Present Illness     Review of Systems is noted in the HPI, as appropriate  Objective:   There were no vitals taken for this visit.  GEN: No acute distress; alert,appropriate. PULM: Breathing comfortably in no respiratory distress PSYCH: Normally interactive.   Laboratory and Imaging Data:  Assessment and Plan:   No diagnosis found. Assessment & Plan   Medication Management during today's office visit: No orders of the defined types were placed in this encounter.  There are no discontinued medications.  Orders placed today for conditions managed today: No orders of the defined types were placed in this encounter.   Disposition: No follow-ups on file.  [employer] One speech-to-text software was used for transcription in this dictation.  Possible transcriptional errors can occur using Animal nutritionist.   Signed,  Jacques DASEN. Jennalee Greaves, MD   Outpatient Encounter Medications as of  03/15/2024  Medication Sig   amphetamine-dextroamphetamine (ADDERALL XR) 20 MG 24 hr capsule Take 20 mg by mouth in the morning, at noon, and at bedtime.   clonazePAM (KLONOPIN) 1 MG tablet Take 1 mg by mouth 3 (three) times daily as needed.    furosemide  (LASIX ) 20 MG tablet Take 1 tablet (20 mg total) by mouth daily as needed.   No facility-administered encounter medications on file as of 03/15/2024.

## 2024-03-15 ENCOUNTER — Ambulatory Visit: Admitting: Family Medicine

## 2024-03-24 ENCOUNTER — Encounter: Payer: Self-pay | Admitting: Family Medicine

## 2024-03-24 NOTE — Progress Notes (Unsigned)
     [name] T. Kristin Decarli, MD, CAQ Sports Medicine [facility] [address] [place], [address]  Phone: [phone]  FAX: [phone]  [name] - 33 y.o. female  MRN [id number]  Date of Birth: [birth year]  Date: 03/26/2024  PCP: Cyndi Shaver, PA-C (Inactive)  Referral: Herlinda Kirk NOVAK, FNP  No chief complaint on file.  Subjective:   [name] is a 33 y.o. very pleasant female patient with There is no height or weight on file to calculate BMI. who presents with the following:  Discussed the use of AI scribe software for clinical note transcription with the patient, who gave verbal consent to proceed.  Patient is here as a new patient office visit.  She most recently was seen at [facility].  History is notable for Adderall XR 20 mg a day  Remote history of Klonopin 1 mg  Recently started on Lasix  20 mg a day Looks like she has been seen by multiple facilities and hospital systems for lower extremity edema History of Present Illness     Review of Systems is noted in the HPI, as appropriate  Objective:   There were no vitals taken for this visit.  GEN: No acute distress; alert,appropriate. PULM: Breathing comfortably in no respiratory distress PSYCH: Normally interactive.   Laboratory and Imaging Data:  Assessment and Plan:   No diagnosis found. Assessment & Plan   Medication Management during today's office visit: No orders of the defined types were placed in this encounter.  There are no discontinued medications.  Orders placed today for conditions managed today: No orders of the defined types were placed in this encounter.   Disposition: No follow-ups on file.  [employer] One speech-to-text software was used for transcription in this dictation.  Possible transcriptional errors can occur using Animal nutritionist.   Signed,  Jacques DASEN. Floretta Petro, MD   Outpatient Encounter Medications as of  03/26/2024  Medication Sig   amphetamine-dextroamphetamine (ADDERALL XR) 20 MG 24 hr capsule Take 20 mg by mouth in the morning, at noon, and at bedtime.   clonazePAM (KLONOPIN) 1 MG tablet Take 1 mg by mouth 3 (three) times daily as needed.    furosemide  (LASIX ) 20 MG tablet Take 1 tablet (20 mg total) by mouth daily as needed.   No facility-administered encounter medications on file as of 03/26/2024.

## 2024-03-26 ENCOUNTER — Ambulatory Visit: Admitting: Family Medicine

## 2024-03-26 ENCOUNTER — Encounter: Payer: Self-pay | Admitting: Family Medicine

## 2024-03-26 VITALS — BP 90/60 | HR 78 | Temp 99.0°F | Ht 63.0 in | Wt 169.2 lb

## 2024-03-26 DIAGNOSIS — R635 Abnormal weight gain: Secondary | ICD-10-CM | POA: Diagnosis not present

## 2024-03-26 DIAGNOSIS — Z13220 Encounter for screening for lipoid disorders: Secondary | ICD-10-CM

## 2024-03-26 DIAGNOSIS — N979 Female infertility, unspecified: Secondary | ICD-10-CM | POA: Diagnosis not present

## 2024-03-26 DIAGNOSIS — Z131 Encounter for screening for diabetes mellitus: Secondary | ICD-10-CM | POA: Diagnosis not present

## 2024-03-26 LAB — CORTISOL: Cortisol, Plasma: 14 ug/dL

## 2024-03-26 LAB — LIPID PANEL
Cholesterol: 196 mg/dL (ref 0–200)
HDL: 71.9 mg/dL (ref >39.00)
LDL Cholesterol: 110 mg/dL — ABNORMAL HIGH (ref 0–99)
NonHDL: 124.45
Total CHOL/HDL Ratio: 3
Triglycerides: 70 mg/dL (ref 0.0–149.0)
VLDL: 14 mg/dL (ref 0.0–40.0)

## 2024-03-26 LAB — HEMOGLOBIN A1C: Hgb A1c MFr Bld: 6.3 % (ref 4.6–6.5)

## 2024-03-27 ENCOUNTER — Ambulatory Visit: Payer: Self-pay | Admitting: Family Medicine

## 2024-03-29 ENCOUNTER — Encounter: Payer: Self-pay | Admitting: Obstetrics and Gynecology

## 2024-03-30 LAB — ESTRADIOL: Estradiol: 125 pg/mL

## 2024-03-30 LAB — FSH/LH
FSH: 2.2 m[IU]/mL
LH: 12.4 m[IU]/mL

## 2024-03-30 LAB — TESTOS,TOTAL,FREE AND SHBG (FEMALE)
Free Testosterone: 2.9 pg/mL (ref 0.1–6.4)
Sex Hormone Binding: 45 nmol/L (ref 17–124)
Testosterone, Total, LC-MS-MS: 24 ng/dL (ref 2–45)

## 2024-03-30 LAB — PROLACTIN: Prolactin: 11.8 ng/mL

## 2024-03-30 LAB — ANTI-MULLERIAN HORMONE (AMH), FEMALE: Anti-Mullerian Hormones(AMH), Female: 2.33 ng/mL (ref 0.36–10.07)

## 2024-06-22 ENCOUNTER — Ambulatory Visit: Admitting: Family Medicine

## 2024-06-22 ENCOUNTER — Encounter: Payer: Self-pay | Admitting: Family Medicine

## 2024-06-22 VITALS — BP 102/70 | HR 130 | Temp 99.3°F | Ht 63.0 in | Wt 166.2 lb

## 2024-06-22 DIAGNOSIS — R000 Tachycardia, unspecified: Secondary | ICD-10-CM | POA: Diagnosis not present

## 2024-06-22 DIAGNOSIS — R2231 Localized swelling, mass and lump, right upper limb: Secondary | ICD-10-CM | POA: Insufficient documentation

## 2024-06-22 DIAGNOSIS — R61 Generalized hyperhidrosis: Secondary | ICD-10-CM | POA: Diagnosis not present

## 2024-06-22 DIAGNOSIS — R5383 Other fatigue: Secondary | ICD-10-CM | POA: Insufficient documentation

## 2024-06-22 NOTE — Progress Notes (Signed)
 "   Patient ID: [name], female    DOB: 1990-12-17, 34 y.o.   MRN: [id number]  This visit was conducted in person.  BP 102/70   Pulse (!) 130   Temp 99.3 F (37.4 C) (Temporal)   Ht 5' 3 (1.6 m)   Wt 166 lb 4 oz (75.4 kg)   LMP 05/28/2024   SpO2 97%   BMI 29.45 kg/m    CC:  Chief Complaint  Patient presents with   Adenopathy    Right Under Arm   Fatigue    Subjective:   HPI: [name] is a 34 y.o. female presenting on 06/22/2024 for Adenopathy (Right Under Arm) and Fatigue    Has been noting fatigue in the last several  months.  Night sweats in the last 6- motnhs   Now in last she has noted a swelling under her right arm. In lastr 2 days.  No pain.  No change in size  No discharge.  No local rash or lymph node.   Recent hormonal eval for infertility and weight gain.  Noting some swelling off and on for swelling.. saw card for this and tachycardia.  Per Dr Darron.. sinus tachy She had a ZIO monitor done in August which showed normal sinus rhythm with an average heart rate of 94 bpm with rare PACs and rare PVCs.  No significant arrhythmia overall. Echocardiogram on September 5 showed normal LV systolic function, normal diastolic function and no significant valvular abnormalities.  Her IVC was normal in size and reactive.   Has cyst in breast per GYN off and on.  No past mammogram.    No family history of breast cancer  No leukemia.       Relevant past medical, surgical, family and social history reviewed and updated as indicated. Interim medical history since our last visit reviewed. Allergies and medications reviewed and updated. Outpatient Medications Prior to Visit  Medication Sig Dispense Refill   amphetamine-dextroamphetamine (ADDERALL XR) 20 MG 24 hr capsule Take 20 mg by mouth in the morning, at noon, and at bedtime.     clonazePAM (KLONOPIN) 1 MG tablet Take 1 mg by mouth 3 (three) times daily as needed.      furosemide  (LASIX ) 20 MG tablet  Take 1 tablet (20 mg total) by mouth daily as needed. 30 tablet 3   No facility-administered medications prior to visit.     Per HPI unless specifically indicated in ROS section below Review of Systems  Constitutional:  Negative for fatigue and fever.  HENT:  Negative for congestion.   Eyes:  Negative for pain.  Respiratory:  Negative for cough and shortness of breath.   Cardiovascular:  Negative for chest pain, palpitations and leg swelling.  Gastrointestinal:  Negative for abdominal pain.  Genitourinary:  Negative for dysuria and vaginal bleeding.  Musculoskeletal:  Negative for back pain.  Neurological:  Negative for syncope, light-headedness and headaches.  Psychiatric/Behavioral:  Negative for dysphoric mood.    Objective:  BP 102/70   Pulse (!) 130   Temp 99.3 F (37.4 C) (Temporal)   Ht 5' 3 (1.6 m)   Wt 166 lb 4 oz (75.4 kg)   LMP 05/28/2024   SpO2 97%   BMI 29.45 kg/m   Wt Readings from Last 3 Encounters:  06/22/24 166 lb 4 oz (75.4 kg)  03/26/24 169 lb 4 oz (76.8 kg)  02/23/24 165 lb 6 oz (75 kg)      Physical Exam Constitutional:  General: She is not in acute distress.    Appearance: Normal appearance. She is well-developed. She is not ill-appearing or toxic-appearing.  HENT:     Head: Normocephalic.     Right Ear: Hearing, tympanic membrane, ear canal and external ear normal. Tympanic membrane is not erythematous, retracted or bulging.     Left Ear: Hearing, tympanic membrane, ear canal and external ear normal. Tympanic membrane is not erythematous, retracted or bulging.     Nose: No mucosal edema or rhinorrhea.     Right Sinus: No maxillary sinus tenderness or frontal sinus tenderness.     Left Sinus: No maxillary sinus tenderness or frontal sinus tenderness.     Mouth/Throat:     Pharynx: Uvula midline.  Eyes:     General: Lids are normal. Lids are everted, no foreign bodies appreciated.     Conjunctiva/sclera: Conjunctivae normal.     Pupils:  Pupils are equal, round, and reactive to light.  Neck:     Thyroid: No thyroid mass or thyromegaly.     Vascular: No carotid bruit.     Trachea: Trachea normal.  Cardiovascular:     Rate and Rhythm: Normal rate and regular rhythm.     Pulses: Normal pulses.     Heart sounds: Normal heart sounds, S1 normal and S2 normal. No murmur heard.    No friction rub. No gallop.  Pulmonary:     Effort: Pulmonary effort is normal. No tachypnea or respiratory distress.     Breath sounds: Normal breath sounds. No decreased breath sounds, wheezing, rhonchi or rales.  Chest:     Chest wall: No mass, lacerations, deformity, swelling or tenderness.  Breasts:    Breasts are symmetrical.     Right: Normal. No swelling, nipple discharge, skin change or tenderness.     Left: Normal. No nipple discharge, skin change or tenderness.     Comments: Small non mobile mass in right upper axillae, no erythema, no central pore Abdominal:     General: Bowel sounds are normal.     Palpations: Abdomen is soft.     Tenderness: There is no abdominal tenderness.  Musculoskeletal:     Cervical back: Normal range of motion and neck supple.  Lymphadenopathy:     Head:     Right side of head: No submental, submandibular, tonsillar, preauricular, posterior auricular or occipital adenopathy.     Left side of head: No submental, submandibular, tonsillar, preauricular, posterior auricular or occipital adenopathy.     Cervical: No cervical adenopathy.     Upper Body:     Right upper body: Axillary adenopathy present. No supraclavicular, pectoral or epitrochlear adenopathy.     Left upper body: No supraclavicular, axillary, pectoral or epitrochlear adenopathy.     Lower Body: No right inguinal adenopathy. No left inguinal adenopathy.  Skin:    General: Skin is warm and dry.     Findings: No rash.  Neurological:     Mental Status: She is alert.  Psychiatric:        Mood and Affect: Mood is not anxious or depressed.         Speech: Speech normal.        Behavior: Behavior normal. Behavior is cooperative.        Thought Content: Thought content normal.        Judgment: Judgment normal.       Results for orders placed or performed in visit on 03/29/24  HM PAP SMEAR   Collection Time: 05/23/23  10:57 AM  Result Value Ref Range   HM Pap smear NILM     Assessment and Plan  Other fatigue Assessment & Plan: Chronic, evaluate with labs.   Orders: -     CBC with Differential/Platelet -     Comprehensive metabolic panel with GFR -     TSH -     Vitamin B12 -     VITAMIN D  25 Hydroxy (Vit-D Deficiency, Fractures) -     US  LIMITED ULTRASOUND INCLUDING AXILLA RIGHT BREAST; Future  Axillary mass, right Assessment & Plan: Acute, cyst versus lymph node in right axilla.  Other lymph node exam negative. Prior to that noting that she has been fatigued and night sweats.  She has had negative hormonal evaluation. Will evaluate further given no sign of source of reactive lymph node. Will send for right breast ultrasound and right axillary US .   Orders: -     US  LIMITED ULTRASOUND INCLUDING AXILLA RIGHT BREAST; Future  Night sweats -     US  LIMITED ULTRASOUND INCLUDING AXILLA RIGHT BREAST; Future  Sinus tachycardia Assessment & Plan: Patient does have significant sinus tachycardia in office today but this is not new for her.  It does not bother her or give her symptoms.  She has had evaluation with cardiology that was unremarkable.  Offered beta-blocker by cardiologist but declined.     No follow-ups on file.   Greig Ring, MD  "

## 2024-06-22 NOTE — Assessment & Plan Note (Signed)
 Patient does have significant sinus tachycardia in office today but this is not new for her.  It does not bother her or give her symptoms.  She has had evaluation with cardiology that was unremarkable.  Offered beta-blocker by cardiologist but declined.

## 2024-06-22 NOTE — Assessment & Plan Note (Signed)
 Acute, cyst versus lymph node in right axilla.  Other lymph node exam negative. Prior to that noting that she has been fatigued and night sweats.  She has had negative hormonal evaluation. Will evaluate further given no sign of source of reactive lymph node. Will send for right breast ultrasound and right axillary US .

## 2024-06-22 NOTE — Assessment & Plan Note (Signed)
 Chronic, evaluate with labs.

## 2024-06-23 LAB — COMPREHENSIVE METABOLIC PANEL WITH GFR
AG Ratio: 1.6 (calc) (ref 1.0–2.5)
ALT: 17 U/L (ref 6–29)
AST: 14 U/L (ref 10–30)
Albumin: 4 g/dL (ref 3.6–5.1)
Alkaline phosphatase (APISO): 62 U/L (ref 31–125)
BUN: 8 mg/dL (ref 7–25)
CO2: 23 mmol/L (ref 20–32)
Calcium: 9.3 mg/dL (ref 8.6–10.2)
Chloride: 105 mmol/L (ref 98–110)
Creat: 0.89 mg/dL (ref 0.50–0.97)
Globulin: 2.5 g/dL (ref 1.9–3.7)
Glucose, Bld: 138 mg/dL — ABNORMAL HIGH (ref 65–99)
Potassium: 3.7 mmol/L (ref 3.5–5.3)
Sodium: 139 mmol/L (ref 135–146)
Total Bilirubin: 0.4 mg/dL (ref 0.2–1.2)
Total Protein: 6.5 g/dL (ref 6.1–8.1)
eGFR: 87 mL/min/1.73m2

## 2024-06-23 LAB — TSH: TSH: 0.89 m[IU]/L

## 2024-06-23 LAB — CBC WITH DIFFERENTIAL/PLATELET
Absolute Lymphocytes: 2046 {cells}/uL (ref 850–3900)
Absolute Monocytes: 763 {cells}/uL (ref 200–950)
Basophils Absolute: 53 {cells}/uL (ref 0–200)
Basophils Relative: 0.5 %
Eosinophils Absolute: 265 {cells}/uL (ref 15–500)
Eosinophils Relative: 2.5 %
HCT: 43.6 % (ref 35.9–46.0)
Hemoglobin: 14.4 g/dL (ref 11.7–15.5)
MCH: 28.6 pg (ref 27.0–33.0)
MCHC: 33 g/dL (ref 31.6–35.4)
MCV: 86.7 fL (ref 81.4–101.7)
MPV: 10.2 fL (ref 7.5–12.5)
Monocytes Relative: 7.2 %
Neutro Abs: 7473 {cells}/uL (ref 1500–7800)
Neutrophils Relative %: 70.5 %
Platelets: 291 Thousand/uL (ref 140–400)
RBC: 5.03 Million/uL (ref 3.80–5.10)
RDW: 13.2 % (ref 11.0–15.0)
Total Lymphocyte: 19.3 %
WBC: 10.6 Thousand/uL (ref 3.8–10.8)

## 2024-06-23 LAB — VITAMIN D 25 HYDROXY (VIT D DEFICIENCY, FRACTURES): Vit D, 25-Hydroxy: 32 ng/mL (ref 30–100)

## 2024-06-23 LAB — VITAMIN B12: Vitamin B-12: 358 pg/mL (ref 200–1100)

## 2024-06-25 ENCOUNTER — Other Ambulatory Visit: Payer: Self-pay | Admitting: Family Medicine

## 2024-06-25 DIAGNOSIS — R2231 Localized swelling, mass and lump, right upper limb: Secondary | ICD-10-CM

## 2024-06-25 DIAGNOSIS — R5383 Other fatigue: Secondary | ICD-10-CM

## 2024-06-25 DIAGNOSIS — R928 Other abnormal and inconclusive findings on diagnostic imaging of breast: Secondary | ICD-10-CM

## 2024-06-25 DIAGNOSIS — Z1231 Encounter for screening mammogram for malignant neoplasm of breast: Secondary | ICD-10-CM

## 2024-06-25 DIAGNOSIS — R61 Generalized hyperhidrosis: Secondary | ICD-10-CM

## 2024-06-26 ENCOUNTER — Ambulatory Visit: Payer: Self-pay | Admitting: Family Medicine

## 2024-06-29 ENCOUNTER — Ambulatory Visit
Admission: RE | Admit: 2024-06-29 | Discharge: 2024-06-29 | Disposition: A | Discharge status: Home / Self Care | Source: Ambulatory Visit | Attending: Family Medicine | Admitting: Family Medicine

## 2024-06-29 DIAGNOSIS — R61 Generalized hyperhidrosis: Secondary | ICD-10-CM | POA: Insufficient documentation

## 2024-06-29 DIAGNOSIS — R2231 Localized swelling, mass and lump, right upper limb: Secondary | ICD-10-CM | POA: Insufficient documentation

## 2024-06-29 DIAGNOSIS — R5383 Other fatigue: Secondary | ICD-10-CM | POA: Insufficient documentation

## 2024-06-29 DIAGNOSIS — Z1231 Encounter for screening mammogram for malignant neoplasm of breast: Secondary | ICD-10-CM | POA: Diagnosis present

## 2024-06-30 ENCOUNTER — Ambulatory Visit: Payer: Self-pay | Admitting: Family Medicine

## 2024-07-02 ENCOUNTER — Other Ambulatory Visit: Payer: Self-pay | Admitting: Family Medicine

## 2024-07-02 DIAGNOSIS — R928 Other abnormal and inconclusive findings on diagnostic imaging of breast: Secondary | ICD-10-CM

## 2024-07-12 ENCOUNTER — Ambulatory Visit: Payer: Self-pay | Admitting: Family Medicine

## 2024-07-12 ENCOUNTER — Ambulatory Visit
Admission: RE | Admit: 2024-07-12 | Discharge: 2024-07-12 | Disposition: A | Discharge status: Home / Self Care | Source: Ambulatory Visit | Attending: Family Medicine | Admitting: Family Medicine

## 2024-07-12 DIAGNOSIS — N6011 Diffuse cystic mastopathy of right breast: Secondary | ICD-10-CM | POA: Insufficient documentation

## 2024-07-12 DIAGNOSIS — R928 Other abnormal and inconclusive findings on diagnostic imaging of breast: Secondary | ICD-10-CM

## 2024-07-12 MED ORDER — LIDOCAINE 1 % OPTIME INJ - NO CHARGE
2.0000 mL | Freq: Once | INTRAMUSCULAR | Status: AC
  Administered 2024-07-12: 2 mL via INTRADERMAL
  Filled 2024-07-12: qty 2

## 2024-07-12 MED ORDER — LIDOCAINE-EPINEPHRINE 1 %-1:100000 IJ SOLN
10.0000 mL | Freq: Once | INTRAMUSCULAR | Status: AC
  Administered 2024-07-12: 10 mL via INTRADERMAL
  Filled 2024-07-12: qty 10

## 2024-07-13 LAB — SURGICAL PATHOLOGY

## 2024-07-17 ENCOUNTER — Telehealth: Payer: Self-pay | Admitting: Cardiovascular Disease

## 2024-07-17 MED ORDER — FUROSEMIDE 20 MG PO TABS: 20.0000 mg | ORAL_TABLET | Freq: Every day | ORAL | 3 refills | Status: AC | PRN

## 2024-07-17 NOTE — Telephone Encounter (Signed)
 Pt scheduled 10/17/24, refill sent.

## 2024-07-17 NOTE — Telephone Encounter (Signed)
" °*  STAT* If patient is at the pharmacy, call can be transferred to refill team.   1. Which medications need to be refilled? (please list name of each medication and dose if known)  furosemide  (LASIX ) 20 MG tablet    2. Would you like to learn more about the convenience, safety, & potential cost savings by using the [facility]? no   3. Are you open to using the [facility] (Type [facility]. No    4. Which pharmacy/location (including street and city if local pharmacy) is medication to be sent to? [id number] - [place], [place] - [address]        5. Do they need a 30 day or 90 day supply? 30 day   "

## 2024-10-17 ENCOUNTER — Ambulatory Visit: Admitting: Cardiovascular Disease
